# Patient Record
Sex: Female | Born: 1965 | Race: White | Hispanic: No | Marital: Married | Smoking: Never smoker
Health system: Southern US, Community
[De-identification: ages and names within clinical notes are randomized; demographics above are authoritative.]

## PROBLEM LIST (undated history)

## (undated) DIAGNOSIS — G709 Myoneural disorder, unspecified: Secondary | ICD-10-CM

## (undated) DIAGNOSIS — Z9889 Other specified postprocedural states: Secondary | ICD-10-CM

## (undated) DIAGNOSIS — K5792 Diverticulitis of intestine, part unspecified, without perforation or abscess without bleeding: Secondary | ICD-10-CM

## (undated) DIAGNOSIS — K8020 Calculus of gallbladder without cholecystitis without obstruction: Secondary | ICD-10-CM

## (undated) DIAGNOSIS — R112 Nausea with vomiting, unspecified: Secondary | ICD-10-CM

## (undated) DIAGNOSIS — R51 Headache: Secondary | ICD-10-CM

## (undated) DIAGNOSIS — Z889 Allergy status to unspecified drugs, medicaments and biological substances status: Secondary | ICD-10-CM

## (undated) DIAGNOSIS — E039 Hypothyroidism, unspecified: Secondary | ICD-10-CM

## (undated) DIAGNOSIS — M1990 Unspecified osteoarthritis, unspecified site: Secondary | ICD-10-CM

## (undated) DIAGNOSIS — I10 Essential (primary) hypertension: Secondary | ICD-10-CM

## (undated) DIAGNOSIS — R519 Headache, unspecified: Secondary | ICD-10-CM

## (undated) DIAGNOSIS — E785 Hyperlipidemia, unspecified: Secondary | ICD-10-CM

## (undated) DIAGNOSIS — E079 Disorder of thyroid, unspecified: Secondary | ICD-10-CM

## (undated) HISTORY — PX: NOSE SURGERY: SHX723

## (undated) HISTORY — DX: Myoneural disorder, unspecified: G70.9

## (undated) HISTORY — DX: Unspecified osteoarthritis, unspecified site: M19.90

## (undated) HISTORY — PX: WISDOM TOOTH EXTRACTION: SHX21

## (undated) HISTORY — DX: Hyperlipidemia, unspecified: E78.5

## (undated) HISTORY — DX: Disorder of thyroid, unspecified: E07.9

## (undated) HISTORY — DX: Essential (primary) hypertension: I10

---

## 1998-06-15 ENCOUNTER — Other Ambulatory Visit: Admission: RE | Admit: 1998-06-15 | Discharge: 1998-06-15 | Payer: Self-pay | Admitting: *Deleted

## 1999-09-07 ENCOUNTER — Other Ambulatory Visit: Admission: RE | Admit: 1999-09-07 | Discharge: 1999-09-07 | Payer: Self-pay | Admitting: *Deleted

## 2000-09-19 ENCOUNTER — Other Ambulatory Visit: Admission: RE | Admit: 2000-09-19 | Discharge: 2000-09-19 | Payer: Self-pay | Admitting: *Deleted

## 2002-04-28 ENCOUNTER — Other Ambulatory Visit: Admission: RE | Admit: 2002-04-28 | Discharge: 2002-04-28 | Payer: Self-pay | Admitting: Obstetrics and Gynecology

## 2002-11-30 ENCOUNTER — Inpatient Hospital Stay (HOSPITAL_COMMUNITY): Admission: AD | Admit: 2002-11-30 | Discharge: 2002-12-02 | Payer: Self-pay | Admitting: Obstetrics and Gynecology

## 2003-01-14 ENCOUNTER — Other Ambulatory Visit: Admission: RE | Admit: 2003-01-14 | Discharge: 2003-01-14 | Payer: Self-pay | Admitting: Obstetrics and Gynecology

## 2003-08-05 ENCOUNTER — Encounter: Admission: RE | Admit: 2003-08-05 | Discharge: 2003-08-05 | Payer: Self-pay | Admitting: Internal Medicine

## 2004-02-19 ENCOUNTER — Other Ambulatory Visit: Admission: RE | Admit: 2004-02-19 | Discharge: 2004-02-19 | Payer: Self-pay

## 2004-02-19 ENCOUNTER — Encounter: Admission: RE | Admit: 2004-02-19 | Discharge: 2004-02-19 | Payer: Self-pay | Admitting: Internal Medicine

## 2004-02-19 ENCOUNTER — Encounter (INDEPENDENT_AMBULATORY_CARE_PROVIDER_SITE_OTHER): Payer: Self-pay | Admitting: *Deleted

## 2004-04-01 ENCOUNTER — Encounter: Admission: RE | Admit: 2004-04-01 | Discharge: 2004-04-01 | Payer: Self-pay | Admitting: Internal Medicine

## 2005-10-13 ENCOUNTER — Encounter: Admission: RE | Admit: 2005-10-13 | Discharge: 2005-10-13 | Payer: Self-pay | Admitting: Neurology

## 2006-05-17 ENCOUNTER — Ambulatory Visit: Payer: Self-pay | Admitting: Orthopedic Surgery

## 2006-06-07 ENCOUNTER — Ambulatory Visit: Payer: Self-pay | Admitting: Orthopedic Surgery

## 2006-07-25 DIAGNOSIS — Z8679 Personal history of other diseases of the circulatory system: Secondary | ICD-10-CM | POA: Insufficient documentation

## 2006-07-26 ENCOUNTER — Ambulatory Visit: Payer: Self-pay | Admitting: Orthopedic Surgery

## 2006-11-07 ENCOUNTER — Encounter: Admission: RE | Admit: 2006-11-07 | Discharge: 2006-11-07 | Payer: Self-pay | Admitting: Obstetrics and Gynecology

## 2006-12-21 ENCOUNTER — Ambulatory Visit: Payer: Self-pay | Admitting: Vascular Surgery

## 2006-12-21 ENCOUNTER — Ambulatory Visit (HOSPITAL_COMMUNITY): Admission: RE | Admit: 2006-12-21 | Discharge: 2006-12-21 | Payer: Self-pay | Admitting: Internal Medicine

## 2007-01-10 ENCOUNTER — Encounter: Admission: RE | Admit: 2007-01-10 | Discharge: 2007-01-10 | Payer: Self-pay | Admitting: Internal Medicine

## 2007-04-17 ENCOUNTER — Encounter: Admission: RE | Admit: 2007-04-17 | Discharge: 2007-04-17 | Payer: Self-pay | Admitting: Endocrinology

## 2007-12-08 ENCOUNTER — Encounter: Admission: RE | Admit: 2007-12-08 | Discharge: 2007-12-08 | Payer: Self-pay | Admitting: Orthopedic Surgery

## 2009-10-13 ENCOUNTER — Encounter: Admission: RE | Admit: 2009-10-13 | Discharge: 2009-10-13 | Payer: Self-pay | Admitting: Surgery

## 2010-01-23 ENCOUNTER — Encounter: Payer: Self-pay | Admitting: Obstetrics and Gynecology

## 2010-04-23 ENCOUNTER — Emergency Department (HOSPITAL_COMMUNITY)
Admission: EM | Admit: 2010-04-23 | Discharge: 2010-04-23 | Disposition: A | Discharge status: Home / Self Care | Payer: Medicare Other | Attending: Emergency Medicine | Admitting: Emergency Medicine

## 2010-04-23 ENCOUNTER — Emergency Department (HOSPITAL_COMMUNITY): Payer: Medicare Other

## 2010-04-23 DIAGNOSIS — E039 Hypothyroidism, unspecified: Secondary | ICD-10-CM | POA: Insufficient documentation

## 2010-04-23 DIAGNOSIS — E860 Dehydration: Secondary | ICD-10-CM | POA: Insufficient documentation

## 2010-04-23 DIAGNOSIS — K5732 Diverticulitis of large intestine without perforation or abscess without bleeding: Secondary | ICD-10-CM | POA: Insufficient documentation

## 2010-04-23 DIAGNOSIS — E669 Obesity, unspecified: Secondary | ICD-10-CM | POA: Insufficient documentation

## 2010-04-23 DIAGNOSIS — R350 Frequency of micturition: Secondary | ICD-10-CM | POA: Insufficient documentation

## 2010-04-23 DIAGNOSIS — R10819 Abdominal tenderness, unspecified site: Secondary | ICD-10-CM | POA: Insufficient documentation

## 2010-04-23 DIAGNOSIS — R109 Unspecified abdominal pain: Secondary | ICD-10-CM | POA: Insufficient documentation

## 2010-04-23 DIAGNOSIS — R197 Diarrhea, unspecified: Secondary | ICD-10-CM | POA: Insufficient documentation

## 2010-04-23 DIAGNOSIS — I10 Essential (primary) hypertension: Secondary | ICD-10-CM | POA: Insufficient documentation

## 2010-04-23 DIAGNOSIS — E119 Type 2 diabetes mellitus without complications: Secondary | ICD-10-CM | POA: Insufficient documentation

## 2010-04-23 LAB — BASIC METABOLIC PANEL
BUN: 5 mg/dL — ABNORMAL LOW (ref 6–23)
CO2: 26 mEq/L (ref 19–32)
Calcium: 9 mg/dL (ref 8.4–10.5)
Chloride: 101 mEq/L (ref 96–112)
Creatinine, Ser: 0.66 mg/dL (ref 0.4–1.2)
GFR calc Af Amer: >60 mL/min (ref >60)
GFR calc non Af Amer: >60 mL/min (ref >60)
Glucose, Bld: 169 mg/dL — ABNORMAL HIGH (ref 70–99)
Potassium: 4.3 mEq/L (ref 3.5–5.1)
Sodium: 137 mEq/L (ref 135–145)

## 2010-04-23 LAB — CBC
HCT: 41.6 % (ref 36.0–46.0)
Hemoglobin: 13.8 g/dL (ref 12.0–15.0)
MCH: 29.8 pg (ref 26.0–34.0)
MCHC: 33.2 g/dL (ref 30.0–36.0)
MCV: 89.8 fL (ref 78.0–100.0)
Platelets: 297 10*3/uL (ref 150–400)
WBC: 16.8 10*3/uL — ABNORMAL HIGH (ref 4.0–10.5)

## 2010-04-23 LAB — HEPATIC FUNCTION PANEL
ALT: 18 U/L (ref 0–35)
AST: 17 U/L (ref 0–37)
Albumin: 3.6 g/dL (ref 3.5–5.2)
Alkaline Phosphatase: 91 U/L (ref 39–117)
Bilirubin, Direct: 0.1 mg/dL (ref 0.0–0.3)
Indirect Bilirubin: 0.7 mg/dL (ref 0.3–0.9)
Total Bilirubin: 0.8 mg/dL (ref 0.3–1.2)
Total Protein: 6.9 g/dL (ref 6.0–8.3)

## 2010-04-23 LAB — DIFFERENTIAL
Basophils Relative: 0 % (ref 0–1)
Eosinophils Absolute: 0.2 10*3/uL (ref 0.0–0.7)
Eosinophils Relative: 1 % (ref 0–5)
Lymphocytes Relative: 12 % (ref 12–46)
Lymphs Abs: 2 10*3/uL (ref 0.7–4.0)
Monocytes Absolute: 1.4 10*3/uL — ABNORMAL HIGH (ref 0.1–1.0)
Monocytes Relative: 8 % (ref 3–12)
Neutro Abs: 13.1 10*3/uL — ABNORMAL HIGH (ref 1.7–7.7)
Neutrophils Relative %: 78 % — ABNORMAL HIGH (ref 43–77)

## 2010-04-23 LAB — URINALYSIS, ROUTINE W REFLEX MICROSCOPIC
Bilirubin Urine: NEGATIVE
Glucose, UA: NEGATIVE mg/dL
Hgb urine dipstick: NEGATIVE
Ketones, ur: 15 mg/dL — AB
Leukocytes, UA: NEGATIVE
Nitrite: NEGATIVE
Protein, ur: 100 mg/dL — AB
Specific Gravity, Urine: 1.01 (ref 1.005–1.030)
Urobilinogen, UA: 0.2 mg/dL (ref 0.0–1.0)

## 2010-04-23 LAB — LIPASE, BLOOD: Lipase: 18 U/L (ref 11–59)

## 2010-04-23 LAB — URINE MICROSCOPIC-ADD ON

## 2010-04-23 LAB — POCT PREGNANCY, URINE: Preg Test, Ur: NEGATIVE

## 2010-04-23 MED ORDER — IOHEXOL 300 MG/ML  SOLN
150.0000 mL | Freq: Once | INTRAMUSCULAR | Status: AC | PRN
  Administered 2010-04-23: 150 mL via INTRAVENOUS

## 2010-05-20 NOTE — H&P (Signed)
Jacqueline Ayala, Jacqueline Ayala                      ACCOUNT NO.:  [id number]   MEDICAL RECORD NO.:  [id number]                   PATIENT TYPE:  INP   LOCATION:  9169                                 FACILITY:  [facility]   PHYSICIAN:  Lenoard Aden, M.D.             DATE OF BIRTH:  Sep 06, 1965   DATE OF ADMISSION:  11/30/2002  DATE OF DISCHARGE:                                HISTORY & PHYSICAL   CHIEF COMPLAINT:  Chronic hypertension for induction.   HISTORY OF PRESENT ILLNESS:  The patient is a 45 year old white female, G2,  P81, EDD of December 13, 2002, at 38+ weeks for induction due to chronic  hypertension.   PAST MEDICAL HISTORY:  Remarkable for vacuum-assisted vaginal delivery of an  8 pound 12 ounce female.  No other medical or surgical hospitalizations.  Medical history of hypertension and hypothyroidism.   MEDICATIONS:  Aldomet and Synthroid.   PRENATAL LABORATORY DATA:  Blood type AB positive, Rh antibody negative,  rubella immune, hepatitis and HIV negative.   PHYSICAL EXAMINATION:  GENERAL:  Well-developed, obese white female in no  acute distress.  HEENT:  Normal.  LUNGS:  Clear.  HEART:  Regular rate and rhythm.  ABDOMEN:  Soft, gravid, and nontender.  Estimated fetal weight 7-1/2 to 8  pounds.  Cervix is 2 cm, 50%, vertex, -2.  EXTREMITIES:  No cords.  NEUROLOGIC:  Nonfocal.   IMPRESSION:  1. Intrauterine pregnancy at 30 weeks.  2. Chronic hypertension, stable on Aldomet.  3. Hypothyroidism, stable.  4. Advanced maternal age with normal amniocentesis.   PLAN:  Proceed with induction.  Risks and benefits discussed.  Watch blood  pressure closely.                                               Lenoard Aden, M.D.    RJT/MEDQ  D:  11/30/2002  T:  11/30/2002  Job:  [id number]

## 2010-07-04 ENCOUNTER — Other Ambulatory Visit: Payer: Self-pay | Admitting: Internal Medicine

## 2010-07-04 DIAGNOSIS — Z1231 Encounter for screening mammogram for malignant neoplasm of breast: Secondary | ICD-10-CM

## 2010-07-26 ENCOUNTER — Ambulatory Visit
Admission: RE | Admit: 2010-07-26 | Discharge: 2010-07-26 | Disposition: A | Discharge status: Home / Self Care | Payer: Medicare Other | Source: Ambulatory Visit | Attending: Internal Medicine | Admitting: Internal Medicine

## 2010-07-26 DIAGNOSIS — Z1231 Encounter for screening mammogram for malignant neoplasm of breast: Secondary | ICD-10-CM

## 2011-04-18 ENCOUNTER — Other Ambulatory Visit: Payer: Self-pay | Admitting: Gastroenterology

## 2011-04-18 DIAGNOSIS — R1013 Epigastric pain: Secondary | ICD-10-CM

## 2011-04-18 DIAGNOSIS — R1110 Vomiting, unspecified: Secondary | ICD-10-CM

## 2011-04-28 ENCOUNTER — Ambulatory Visit (HOSPITAL_COMMUNITY): Payer: Medicare Other

## 2011-04-28 ENCOUNTER — Ambulatory Visit (HOSPITAL_COMMUNITY)
Admission: RE | Admit: 2011-04-28 | Discharge: 2011-04-28 | Disposition: A | Discharge status: Home / Self Care | Payer: Medicare Other | Source: Ambulatory Visit | Attending: Gastroenterology | Admitting: Gastroenterology

## 2011-04-28 DIAGNOSIS — R1013 Epigastric pain: Secondary | ICD-10-CM | POA: Insufficient documentation

## 2011-04-28 DIAGNOSIS — R1110 Vomiting, unspecified: Secondary | ICD-10-CM | POA: Insufficient documentation

## 2011-04-28 DIAGNOSIS — K8020 Calculus of gallbladder without cholecystitis without obstruction: Secondary | ICD-10-CM | POA: Insufficient documentation

## 2011-05-13 ENCOUNTER — Emergency Department (HOSPITAL_COMMUNITY)
Admission: EM | Admit: 2011-05-13 | Discharge: 2011-05-13 | Disposition: A | Discharge status: Home / Self Care | Payer: Medicare Other | Attending: Emergency Medicine | Admitting: Emergency Medicine

## 2011-05-13 ENCOUNTER — Encounter (HOSPITAL_COMMUNITY): Payer: Self-pay

## 2011-05-13 DIAGNOSIS — K5792 Diverticulitis of intestine, part unspecified, without perforation or abscess without bleeding: Secondary | ICD-10-CM

## 2011-05-13 DIAGNOSIS — K5732 Diverticulitis of large intestine without perforation or abscess without bleeding: Secondary | ICD-10-CM | POA: Insufficient documentation

## 2011-05-13 DIAGNOSIS — R030 Elevated blood-pressure reading, without diagnosis of hypertension: Secondary | ICD-10-CM | POA: Insufficient documentation

## 2011-05-13 DIAGNOSIS — Z886 Allergy status to analgesic agent status: Secondary | ICD-10-CM | POA: Insufficient documentation

## 2011-05-13 HISTORY — DX: Diverticulitis of intestine, part unspecified, without perforation or abscess without bleeding: K57.92

## 2011-05-13 HISTORY — DX: Calculus of gallbladder without cholecystitis without obstruction: K80.20

## 2011-05-13 MED ORDER — CIPROFLOXACIN HCL 500 MG PO TABS: 500.0000 mg | ORAL_TABLET | Freq: Two times a day (BID) | ORAL | Status: AC

## 2011-05-13 MED ORDER — METRONIDAZOLE 500 MG PO TABS: 500.0000 mg | ORAL_TABLET | Freq: Two times a day (BID) | ORAL | Status: AC

## 2011-05-13 NOTE — ED Notes (Signed)
Hx of diverticulitis, began with abdominal pain last night, denies any n/v/d did have diarrhea last night.  Went to her MD this am and her WBC. Is 17.6 they did not feel comfortable treating that

## 2011-05-13 NOTE — ED Provider Notes (Signed)
History     CSN: [id number]  Arrival date & time 05/13/11  1039   First MD Initiated Contact with Patient 05/13/11 1112      Chief Complaint  Patient presents with  . Abdominal Pain    (Consider location/radiation/quality/duration/timing/severity/associated sxs/prior treatment) Patient is a 46 y.o. female presenting with abdominal pain. The history is provided by the patient and medical records.  Abdominal Pain  Pt with hx cholelithiasis and diverticulitis, acute onset left lower quadrant abdominal pain last night. Pain sharp, waxing and waning, nonradiating. She has had intermittent associated nausea when the pain becomes severe. She had one episode of nonbloody diarrhea last night, none today. She denies any associated fever, chills, chest pain, shortness of breath, vomiting, dysuria, hematuria, vaginal discharge. She is currently on her menses and denies any suspicion of pregnancy. Palpation of the area makes the pain worse, Toradol given to her by the walk-in clinic for primary Dr. has made the pain much better. No prior abdominal surgeries. Patient reports this feels just like prior episodes of diverticulitis when the pain begins. Her initial episode was in April 2012 and she had a CT scan confirming the diagnosis. In October 2012, she began to have symptoms and presented promptly to her primary doctor's office; he wrote her for prescriptions for Cipro and Flagyl and her symptoms improved quickly. Per note from urgent care physician, she had a leukocytosis with a white blood cell count of 17,600 and they were uncomfortable continuing her treatment there.  Past Medical History  Diagnosis Date  . Diverticulitis   . Gallstones     History reviewed. No pertinent past surgical history.  No family history on file.  History  Substance Use Topics  . Smoking status: Never Smoker   . Smokeless tobacco: Not on file  . Alcohol Use: No     Review of Systems  Gastrointestinal: Positive  for abdominal pain.  10 systems reviewed and are otherwise negative for acute change except as noted in the HPI.   Allergies  Codeine and Ibuprofen  Home Medications   Current Outpatient Rx  Name Route Sig Dispense Refill  . CALCIUM CARBONATE-VITAMIN D 500-200 MG-UNIT PO TABS Oral Take 1 tablet by mouth daily.    Marland Kitchen LEVOTHYROXINE SODIUM 175 MCG PO TABS Oral Take 175 mcg by mouth daily.    Marland Kitchen LORATADINE 10 MG PO TABS Oral Take 10 mg by mouth daily.    . ADULT MULTIVITAMIN W/MINERALS CH Oral Take 1 tablet by mouth daily.    Marland Kitchen RAMIPRIL 5 MG PO CAPS Oral Take 5 mg by mouth daily.      BP 160/69  Pulse 88  Temp(Src) 98.3 F (36.8 C) (Oral)  Resp 20  SpO2 99%  LMP 05/08/2011  Physical Exam  Constitutional: She appears well-developed and well-nourished. No distress.       Vital signs are reviewed and are significant for hypertension.  HENT:  Head: Normocephalic and atraumatic.  Right Ear: External ear normal.  Left Ear: External ear normal.       Oral mucosa moist  Eyes: Conjunctivae are normal. Pupils are equal, round, and reactive to light.  Neck: Neck supple.  Cardiovascular: Normal rate, regular rhythm, normal heart sounds and intact distal pulses.   Pulmonary/Chest: Effort normal and breath sounds normal. No respiratory distress. She has no wheezes. She exhibits no tenderness.  Abdominal: Soft. Bowel sounds are normal. She exhibits no distension. There is Tenderness: mild right upper quadrant tenderness without Murphy's sign. Moderate left  lower quadrant tenderness.. There is no rigidity, no rebound and no guarding.       Obese  Musculoskeletal: She exhibits no edema and no tenderness.  Neurological: She is alert.       Speech clear. Mental status appears appropriate for patient and situation.  Skin: Skin is warm and dry. No rash noted.  Psychiatric: She has a normal mood and affect.    ED Course  Procedures (including critical care time)  Labs Reviewed - No data to  display No results found.   Dx 1: Diverticulitis   MDM  I reviewed the labs from urgent care. Leukocytosis seen on CBC without anemia. Urinalysis significant only for hematuria-the patient is currently on her menses and this is the likely explanation. No peritoneal signs on exam. Good bowel sounds on exam. Afebrile without tachycardia. Given 1 episode of diarrhea, I do not feel an electrolyte panel is necessary. Most likely cause of pain is recurrence of diverticulitis. I am hesitant to re-image a young female in this situation. She will be treated with cipro and flagyl with strict return precautions. Pt has good follow-up and appears reasonable, agrees with this plan.        Shaaron Adler, [place] 05/13/11 1207

## 2011-05-13 NOTE — ED Notes (Signed)
Pt in room from triage, pt getting undressed, would like a larger gown to cover her back

## 2011-05-13 NOTE — Discharge Instructions (Signed)
As we discussed, please return for re-evaluation if you develop fever, worsening pain, or cannot keep down your medications.     Diverticulitis Small pockets or "bubbles" can develop in the wall of the intestine. Diverticulitis is when those pockets become infected and inflamed. This causes stomach pain (usually on the left side). HOME CARE  Take all medicine as told by your doctor.   Try a clear liquid diet (broth, tea, or water) for as long as told by your doctor.   Keep all follow-up visits with your doctor.   You may be put on a low-fiber diet once you start feeling better. Here are foods that have low-fiber:   White breads, cereals, rice, and pasta.   Cooked fruits and vegetables or soft fresh fruits and vegetables without the skin.   Ground or well-cooked tender beef, ham, veal, lamb, pork, or poultry.   Eggs and seafood.   After you are doing well on the low-fiber diet, you may be put on a high-fiber diet. Here are ways to increase your fiber:   Choose whole-grain breads, cereals, pasta, and brown rice.   Choose fruits and vegetables with skin on. Do not overcook the vegetables.   Choose nuts, seeds, legumes, dried peas, beans, and lentils.   Look for food products that have more than 3 grams of fiber per serving on the food label.  GET HELP RIGHT AWAY IF:  Your pain does not get better or gets worse.   You have trouble eating food.   You are not pooping (having bowel movements) like normal.   You have a temperature by mouth above 102 F (38.9 C), not controlled by medicine.   You keep throwing up (vomiting).   You have bloody or black, tarry poop (stools).   You are getting worse and not better.  MAKE SURE YOU:   Understand these instructions.   Will watch your condition.   Will get help right away if you are not doing well or get worse.  Document Released: 06/07/2007 Document Revised: 12/08/2010 Document Reviewed: 11/09/2008 [employer] Patient  Information 2012 [place], [place].

## 2011-05-13 NOTE — ED Notes (Signed)
Pt undressed and placed in a gown  

## 2011-05-15 NOTE — ED Provider Notes (Signed)
Medical screening examination/treatment/procedure(s) were performed by non-physician practitioner and as supervising physician I was immediately available for consultation/collaboration.   Geoffery Lyons, MD 05/15/11 [phone]

## 2011-06-08 ENCOUNTER — Encounter (INDEPENDENT_AMBULATORY_CARE_PROVIDER_SITE_OTHER): Payer: Self-pay | Admitting: General Surgery

## 2011-06-08 DIAGNOSIS — G600 Hereditary motor and sensory neuropathy: Secondary | ICD-10-CM | POA: Insufficient documentation

## 2011-06-08 DIAGNOSIS — E039 Hypothyroidism, unspecified: Secondary | ICD-10-CM | POA: Insufficient documentation

## 2011-06-08 DIAGNOSIS — K5792 Diverticulitis of intestine, part unspecified, without perforation or abscess without bleeding: Secondary | ICD-10-CM

## 2011-06-08 DIAGNOSIS — M1990 Unspecified osteoarthritis, unspecified site: Secondary | ICD-10-CM | POA: Insufficient documentation

## 2011-06-08 DIAGNOSIS — J302 Other seasonal allergic rhinitis: Secondary | ICD-10-CM

## 2011-06-08 DIAGNOSIS — E785 Hyperlipidemia, unspecified: Secondary | ICD-10-CM | POA: Insufficient documentation

## 2011-06-15 ENCOUNTER — Ambulatory Visit (INDEPENDENT_AMBULATORY_CARE_PROVIDER_SITE_OTHER): Payer: Medicare Other | Admitting: Surgery

## 2011-06-15 ENCOUNTER — Other Ambulatory Visit (INDEPENDENT_AMBULATORY_CARE_PROVIDER_SITE_OTHER): Payer: Self-pay | Admitting: Surgery

## 2011-06-15 ENCOUNTER — Encounter (INDEPENDENT_AMBULATORY_CARE_PROVIDER_SITE_OTHER): Payer: Self-pay | Admitting: Surgery

## 2011-06-15 VITALS — BP 134/90 | HR 89 | Temp 98.0°F | Ht 67.5 in | Wt 290.4 lb

## 2011-06-15 DIAGNOSIS — K8020 Calculus of gallbladder without cholecystitis without obstruction: Secondary | ICD-10-CM

## 2011-06-15 NOTE — Progress Notes (Signed)
Chief Complaint:  RUQ pain and gallstones  History of Present Illness:  Jacqueline Ayala is an 45 y.o. 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  She has been Having upper abdominal pain after eating fatty foods. To her credit she has been on a diet and lost a lot of weight in that was probably the origin of her gallstones. She is followed by Dr. polite.  I discussed laparoscopic cholecystectomy with her including its complications not limited to, bile duct injury bile leak or injury to surrounding bowel. She is aware of the risk. She wants to proceed with cholecystectomy.  Past Medical History  Diagnosis Date  . Diverticulitis   . Gallstones   . Arthritis   . Hyperlipidemia   . Hypertension   . Neuromuscular disorder   . Thyroid disease     Past Surgical History  Procedure Date  . Nose surgery     removal of bone spur  . Wisdom tooth extraction     Current Outpatient Prescriptions  Medication Sig Dispense Refill  . BIOTIN PO Take by mouth.      . calcium-vitamin D (OSCAL WITH D) 500-200 MG-UNIT per tablet Take 1 tablet by mouth daily.      . fish oil-omega-3 fatty acids 1000 MG capsule Take 2 g by mouth daily.      . levothyroxine (SYNTHROID, LEVOTHROID) 175 MCG tablet Take 175 mcg by mouth daily.      . loratadine (CLARITIN) 10 MG tablet Take 10 mg by mouth daily.      . metFORMIN (GLUCOPHAGE) 1000 MG tablet Take 1,000 mg by mouth 2 (two) times daily with a meal.      . Multiple Vitamin (MULITIVITAMIN WITH MINERALS) TABS Take 1 tablet by mouth daily.      . ramipril (ALTACE) 5 MG capsule Take 5 mg by mouth daily.       Codeine and Ibuprofen Family History  Problem Relation Age of Onset  . Heart disease Father    Social History:   reports that she has never smoked. She does not have any smokeless tobacco history on file. She reports that she does not drink alcohol or use illicit drugs.   REVIEW OF SYSTEMS - PERTINENT POSITIVES ONLY: CMT  as mentioned above.  This is demyelinating disease of the distal limbs  Physical Exam:   Blood pressure 134/90, pulse 89, temperature 98 F (36.7 C), temperature source Temporal, height 5' 7.5" (1.715 m), weight 290 lb 6.4 oz (131.725 kg), SpO2 97.00%. Body mass index is 44.81 kg/(m^2).  Gen:  WDWN WF NAD  Neurological: Alert and oriented to person, place, and time. Motor and sensory function is grossly intact  Head: Normocephalic and atraumatic.  Eyes: Conjunctivae are normal. Pupils are equal, round, and reactive to light. No scleral icterus.  Neck: Normal range of motion. Neck supple. No tracheal deviation or thyromegaly present.  Cardiovascular:  SR without murmurs or gallops.  No carotid bruits Respiratory: Effort normal.  No respiratory distress. No chest wall tenderness. Breath sounds normal.  No wheezes, rales or rhonchi.  Abdomen:  Obese but nontender GU: Musculoskeletal: Normal range of motion. Extremities are nontender. No cyanosis, edema or clubbing noted Lymphadenopathy: No cervical, preauricular, postauricular or axillary adenopathy is present Skin: Skin is warm and dry. No rash noted. No diaphoresis. No erythema. No pallor. Pscyh: Normal mood and affect. Behavior is normal. Judgment and thought content normal.   LABORATORY RESULTS: No results   found for this or any previous visit (from the past 48 hour(s)).  RADIOLOGY RESULTS: No results found.  Problem List: Patient Active Problem List  Diagnosis  . HIGH BLOOD PRESSURE  . Hypothyroidism  . Hyperlipemia  . Arthritis  . Charcot-Marie-Tooth disease  . Seasonal allergies  . Diverticulitis    Assessment & Plan: Gallstones (symptomatic)  Lap chole at [facility]    Matt B. Markian Glockner, MD, FACS  [facility] [phone] beeper [phone]  06/15/2011 10:56 AM     

## 2011-06-15 NOTE — Patient Instructions (Signed)

## 2011-06-19 ENCOUNTER — Encounter (HOSPITAL_COMMUNITY)
Admission: RE | Admit: 2011-06-19 | Discharge: 2011-06-19 | Disposition: A | Discharge status: Home / Self Care | Payer: Medicare Other | Source: Ambulatory Visit | Attending: Surgery | Admitting: Surgery

## 2011-06-19 ENCOUNTER — Encounter (HOSPITAL_COMMUNITY): Payer: Self-pay | Admitting: Pharmacy Technician

## 2011-06-19 ENCOUNTER — Ambulatory Visit (HOSPITAL_COMMUNITY)
Admission: RE | Admit: 2011-06-19 | Discharge: 2011-06-19 | Disposition: A | Discharge status: Home / Self Care | Payer: Medicare Other | Source: Ambulatory Visit | Attending: Surgery | Admitting: Surgery

## 2011-06-19 ENCOUNTER — Encounter (HOSPITAL_COMMUNITY): Payer: Self-pay

## 2011-06-19 DIAGNOSIS — I10 Essential (primary) hypertension: Secondary | ICD-10-CM | POA: Insufficient documentation

## 2011-06-19 DIAGNOSIS — E119 Type 2 diabetes mellitus without complications: Secondary | ICD-10-CM | POA: Insufficient documentation

## 2011-06-19 DIAGNOSIS — K8020 Calculus of gallbladder without cholecystitis without obstruction: Secondary | ICD-10-CM | POA: Insufficient documentation

## 2011-06-19 DIAGNOSIS — Z01810 Encounter for preprocedural cardiovascular examination: Secondary | ICD-10-CM | POA: Insufficient documentation

## 2011-06-19 DIAGNOSIS — Z01812 Encounter for preprocedural laboratory examination: Secondary | ICD-10-CM | POA: Insufficient documentation

## 2011-06-19 HISTORY — DX: Other specified postprocedural states: Z98.890

## 2011-06-19 HISTORY — DX: Nausea with vomiting, unspecified: R11.2

## 2011-06-19 HISTORY — DX: Hypothyroidism, unspecified: E03.9

## 2011-06-19 LAB — CBC
HCT: 40.6 % (ref 36.0–46.0)
Hemoglobin: 13.1 g/dL (ref 12.0–15.0)
WBC: 10 10*3/uL (ref 4.0–10.5)

## 2011-06-19 LAB — SURGICAL PCR SCREEN
MRSA, PCR: NEGATIVE
Staphylococcus aureus: NEGATIVE

## 2011-06-19 LAB — BASIC METABOLIC PANEL
BUN: 10 mg/dL (ref 6–23)
Chloride: 103 mEq/L (ref 96–112)
GFR calc Af Amer: >90 mL/min (ref >90)
Glucose, Bld: 83 mg/dL (ref 70–99)
Potassium: 4 mEq/L (ref 3.5–5.1)
Sodium: 141 mEq/L (ref 135–145)

## 2011-06-19 NOTE — Patient Instructions (Signed)
20 Ermel M. O'Briant  06/19/2011   Your procedure is scheduled on:  06/21/11 115pm-255pm  Report to [facility] at 1045 AM.  Call this number if you have problems the morning of surgery: [phone]   Remember: Fleets enema nite before surgery    Do not eat food:After Midnight.  May have clear liquids:until 0700am then npo .  Clear liquids include soda, tea, black coffee, apple or grape juice, broth.  Take these medicines the morning of surgery with A SIP OF WATER:    Do not wear jewelry, make-up or nail polish.  Do not wear lotions, powders, or perfumes.   Do not shave 48 hours prior to surgery. .  Do not bring valuables to the hospital.  Contacts, dentures or bridgework may not be worn into surgery.  Leave suitcase in the car. After surgery it may be brought to your room.  For patients admitted to the hospital, checkout time is 11:00 AM the day of discharge.  Marland Kitchen   Special Instructions: CHG Shower Use Special Wash: 1/2 bottle night before surgery and 1/2 bottle morning of surgery. shower chin to toes with CHG.  Wash face and private parts with regular soap.     Please read over the following fact sheets that you were given: MRSA Information, coughing and deep breathing exercises, leg exercises

## 2011-06-19 NOTE — Pre-Procedure Instructions (Signed)
06/19/11 requested most recent office visit note by fax from [facility] with medical release form completed.

## 2011-06-19 NOTE — Pre-Procedure Instructions (Signed)
06/19/11 patient concerned over fleets enema and recent exacerbation of divierticulosis.  Pt to call office of Dr Luretha Murphy and question fleets enema.

## 2011-06-20 NOTE — Progress Notes (Signed)
06/20/11 Confirmed EKG in EPIC from 06/19/11 shown to Dr Acey Lav.Marland Kitchen PCP of Dr Renford Dills  Has no ekg in file for patient.  Dr Acey Lav approved ekg for surgery.

## 2011-06-21 ENCOUNTER — Encounter (HOSPITAL_COMMUNITY)
Admission: RE | Disposition: A | Discharge status: Home / Self Care | Payer: Self-pay | Source: Ambulatory Visit | Attending: Surgery

## 2011-06-21 ENCOUNTER — Encounter (HOSPITAL_COMMUNITY): Payer: Self-pay | Admitting: Anesthesiology

## 2011-06-21 ENCOUNTER — Ambulatory Visit (HOSPITAL_COMMUNITY): Payer: Medicare Other

## 2011-06-21 ENCOUNTER — Ambulatory Visit (HOSPITAL_COMMUNITY)
Admission: RE | Admit: 2011-06-21 | Discharge: 2011-06-22 | Disposition: A | Discharge status: Home / Self Care | Payer: Medicare Other | Source: Ambulatory Visit | Attending: Surgery | Admitting: Surgery

## 2011-06-21 ENCOUNTER — Encounter (HOSPITAL_COMMUNITY): Payer: Self-pay | Admitting: *Deleted

## 2011-06-21 ENCOUNTER — Ambulatory Visit (HOSPITAL_COMMUNITY): Payer: Medicare Other | Admitting: Anesthesiology

## 2011-06-21 DIAGNOSIS — I10 Essential (primary) hypertension: Secondary | ICD-10-CM | POA: Insufficient documentation

## 2011-06-21 DIAGNOSIS — Z79899 Other long term (current) drug therapy: Secondary | ICD-10-CM | POA: Insufficient documentation

## 2011-06-21 DIAGNOSIS — Z8679 Personal history of other diseases of the circulatory system: Secondary | ICD-10-CM

## 2011-06-21 DIAGNOSIS — E785 Hyperlipidemia, unspecified: Secondary | ICD-10-CM | POA: Insufficient documentation

## 2011-06-21 DIAGNOSIS — K8010 Calculus of gallbladder with chronic cholecystitis without obstruction: Secondary | ICD-10-CM | POA: Insufficient documentation

## 2011-06-21 HISTORY — PX: CHOLECYSTECTOMY: SHX55

## 2011-06-21 LAB — CBC
HCT: 41.3 % (ref 36.0–46.0)
MCH: 30.3 pg (ref 26.0–34.0)
MCV: 91.4 fL (ref 78.0–100.0)
Platelets: 322 10*3/uL (ref 150–400)
RDW: 13.8 % (ref 11.5–15.5)

## 2011-06-21 SURGERY — LAPAROSCOPIC CHOLECYSTECTOMY WITH INTRAOPERATIVE CHOLANGIOGRAM
Anesthesia: General | Site: Abdomen | Wound class: Clean Contaminated

## 2011-06-21 MED ORDER — BUPIVACAINE LIPOSOME 1.3 % IJ SUSP
INTRAMUSCULAR | Status: DC | PRN
  Administered 2011-06-21: 20 mL

## 2011-06-21 MED ORDER — CEFAZOLIN SODIUM-DEXTROSE 2-3 GM-% IV SOLR
2.0000 g | INTRAVENOUS | Status: AC
  Administered 2011-06-21: 2 g via INTRAVENOUS

## 2011-06-21 MED ORDER — IOHEXOL 300 MG/ML  SOLN
INTRAMUSCULAR | Status: AC
  Filled 2011-06-21: qty 1

## 2011-06-21 MED ORDER — HEPARIN SODIUM (PORCINE) 5000 UNIT/ML IJ SOLN
INTRAMUSCULAR | Status: AC
  Filled 2011-06-21: qty 1

## 2011-06-21 MED ORDER — NEOSTIGMINE METHYLSULFATE 1 MG/ML IJ SOLN
INTRAMUSCULAR | Status: DC | PRN
  Administered 2011-06-21: 5 mg via INTRAVENOUS

## 2011-06-21 MED ORDER — ACETAMINOPHEN 10 MG/ML IV SOLN
INTRAVENOUS | Status: DC | PRN
  Administered 2011-06-21: 1000 mg via INTRAVENOUS

## 2011-06-21 MED ORDER — LACTATED RINGERS IR SOLN
Status: DC | PRN
  Administered 2011-06-21: 1000 mL

## 2011-06-21 MED ORDER — DEXAMETHASONE SODIUM PHOSPHATE 4 MG/ML IJ SOLN
INTRAMUSCULAR | Status: DC | PRN
  Administered 2011-06-21: 10 mg via INTRAVENOUS

## 2011-06-21 MED ORDER — SODIUM CHLORIDE 0.9 % IR SOLN
Status: DC | PRN
  Administered 2011-06-21: 1000 mL

## 2011-06-21 MED ORDER — KCL IN DEXTROSE-NACL 20-5-0.45 MEQ/L-%-% IV SOLN
INTRAVENOUS | Status: DC
  Administered 2011-06-21: 19:00:00 via INTRAVENOUS
  Filled 2011-06-21 (×3): qty 1000

## 2011-06-21 MED ORDER — GLYCOPYRROLATE 0.2 MG/ML IJ SOLN
INTRAMUSCULAR | Status: DC | PRN
  Administered 2011-06-21: .8 mg via INTRAVENOUS

## 2011-06-21 MED ORDER — LEVOTHYROXINE SODIUM 175 MCG PO TABS
175.0000 ug | ORAL_TABLET | Freq: Every day | ORAL | Status: DC
  Administered 2011-06-22: 175 ug via ORAL
  Filled 2011-06-21 (×2): qty 1

## 2011-06-21 MED ORDER — LIDOCAINE HCL (CARDIAC) 20 MG/ML IV SOLN
INTRAVENOUS | Status: DC | PRN
  Administered 2011-06-21: 100 mg via INTRAVENOUS

## 2011-06-21 MED ORDER — RAMIPRIL 5 MG PO CAPS
5.0000 mg | ORAL_CAPSULE | Freq: Every day | ORAL | Status: DC
  Administered 2011-06-21: 5 mg via ORAL
  Filled 2011-06-21 (×2): qty 1

## 2011-06-21 MED ORDER — IOHEXOL 300 MG/ML  SOLN
INTRAMUSCULAR | Status: DC | PRN
  Administered 2011-06-21: 17.5 mL via INTRAVENOUS

## 2011-06-21 MED ORDER — FLEET ENEMA 7-19 GM/118ML RE ENEM: 1.0000 | ENEMA | Freq: Once | RECTAL | Status: DC

## 2011-06-21 MED ORDER — CHLORHEXIDINE GLUCONATE 4 % EX LIQD
1.0000 "application " | Freq: Once | CUTANEOUS | Status: DC
  Filled 2011-06-21: qty 15

## 2011-06-21 MED ORDER — OXYCODONE-ACETAMINOPHEN 5-325 MG PO TABS
1.0000 | ORAL_TABLET | ORAL | Status: DC | PRN
  Administered 2011-06-21: 1 via ORAL
  Administered 2011-06-22 (×2): 2 via ORAL
  Filled 2011-06-21: qty 1
  Filled 2011-06-21 (×2): qty 2

## 2011-06-21 MED ORDER — LABETALOL HCL 5 MG/ML IV SOLN
INTRAVENOUS | Status: DC | PRN
  Administered 2011-06-21: 5 mg via INTRAVENOUS

## 2011-06-21 MED ORDER — LACTATED RINGERS IV SOLN: INTRAVENOUS | Status: DC

## 2011-06-21 MED ORDER — CEFAZOLIN SODIUM-DEXTROSE 2-3 GM-% IV SOLR
INTRAVENOUS | Status: AC
  Filled 2011-06-21: qty 50

## 2011-06-21 MED ORDER — SCOPOLAMINE 1 MG/3DAYS TD PT72
MEDICATED_PATCH | TRANSDERMAL | Status: AC
  Filled 2011-06-21: qty 1

## 2011-06-21 MED ORDER — PROMETHAZINE HCL 25 MG/ML IJ SOLN: 6.2500 mg | INTRAMUSCULAR | Status: DC | PRN

## 2011-06-21 MED ORDER — ONDANSETRON HCL 4 MG/2ML IJ SOLN
INTRAMUSCULAR | Status: DC | PRN
  Administered 2011-06-21: 4 mg via INTRAVENOUS

## 2011-06-21 MED ORDER — LACTATED RINGERS IV SOLN
INTRAVENOUS | Status: DC
  Administered 2011-06-21 (×2): via INTRAVENOUS

## 2011-06-21 MED ORDER — METFORMIN HCL 500 MG PO TABS
1000.0000 mg | ORAL_TABLET | Freq: Two times a day (BID) | ORAL | Status: DC
  Administered 2011-06-22: 1000 mg via ORAL
  Filled 2011-06-21 (×3): qty 2

## 2011-06-21 MED ORDER — HEPARIN SODIUM (PORCINE) 5000 UNIT/ML IJ SOLN
5000.0000 [IU] | Freq: Once | INTRAMUSCULAR | Status: AC
  Administered 2011-06-21: 5000 [IU] via SUBCUTANEOUS

## 2011-06-21 MED ORDER — INSULIN ASPART 100 UNIT/ML ~~LOC~~ SOLN
0.0000 [IU] | SUBCUTANEOUS | Status: DC
  Administered 2011-06-21 – 2011-06-22 (×3): 3 [IU] via SUBCUTANEOUS

## 2011-06-21 MED ORDER — BUPIVACAINE-EPINEPHRINE PF 0.25-1:200000 % IJ SOLN
INTRAMUSCULAR | Status: AC
  Filled 2011-06-21: qty 30

## 2011-06-21 MED ORDER — INSULIN GLARGINE 100 UNIT/ML ~~LOC~~ SOLN
5.0000 [IU] | Freq: Every day | SUBCUTANEOUS | Status: DC
  Administered 2011-06-21: 5 [IU] via SUBCUTANEOUS

## 2011-06-21 MED ORDER — SCOPOLAMINE 1 MG/3DAYS TD PT72
1.0000 | MEDICATED_PATCH | TRANSDERMAL | Status: DC
  Administered 2011-06-21: 1.5 mg via TRANSDERMAL
  Filled 2011-06-21: qty 1

## 2011-06-21 MED ORDER — BUPIVACAINE LIPOSOME 1.3 % IJ SUSP
20.0000 mL | Freq: Once | INTRAMUSCULAR | Status: DC
  Filled 2011-06-21: qty 20

## 2011-06-21 MED ORDER — HEPARIN SODIUM (PORCINE) 5000 UNIT/ML IJ SOLN
5000.0000 [IU] | Freq: Three times a day (TID) | INTRAMUSCULAR | Status: DC
  Administered 2011-06-21 – 2011-06-22 (×2): 5000 [IU] via SUBCUTANEOUS
  Filled 2011-06-21 (×5): qty 1

## 2011-06-21 MED ORDER — HYDROMORPHONE HCL PF 1 MG/ML IJ SOLN
1.0000 mg | INTRAMUSCULAR | Status: DC | PRN
  Administered 2011-06-22: 1 mg via INTRAVENOUS
  Filled 2011-06-21: qty 1

## 2011-06-21 MED ORDER — HYDROMORPHONE HCL PF 1 MG/ML IJ SOLN
INTRAMUSCULAR | Status: DC | PRN
  Administered 2011-06-21 (×4): .4 mg via INTRAVENOUS

## 2011-06-21 MED ORDER — CISATRACURIUM BESYLATE (PF) 10 MG/5ML IV SOLN
INTRAVENOUS | Status: DC | PRN
  Administered 2011-06-21: 1 mg via INTRAVENOUS
  Administered 2011-06-21: 2 mg via INTRAVENOUS
  Administered 2011-06-21: 12 mg via INTRAVENOUS
  Administered 2011-06-21: 1 mg via INTRAVENOUS

## 2011-06-21 MED ORDER — SODIUM CHLORIDE 0.9 % IJ SOLN
INTRAMUSCULAR | Status: DC | PRN
  Administered 2011-06-21: 20 mL

## 2011-06-21 MED ORDER — PROPOFOL 10 MG/ML IV EMUL
INTRAVENOUS | Status: DC | PRN
  Administered 2011-06-21: 200 mg via INTRAVENOUS

## 2011-06-21 MED ORDER — MIDAZOLAM HCL 5 MG/5ML IJ SOLN
INTRAMUSCULAR | Status: DC | PRN
  Administered 2011-06-21: 2 mg via INTRAVENOUS

## 2011-06-21 MED ORDER — HYDROMORPHONE HCL PF 1 MG/ML IJ SOLN: 0.2500 mg | INTRAMUSCULAR | Status: DC | PRN

## 2011-06-21 MED ORDER — FENTANYL CITRATE 0.05 MG/ML IJ SOLN
INTRAMUSCULAR | Status: DC | PRN
  Administered 2011-06-21: 50 ug via INTRAVENOUS
  Administered 2011-06-21: 150 ug via INTRAVENOUS
  Administered 2011-06-21: 100 ug via INTRAVENOUS
  Administered 2011-06-21: 50 ug via INTRAVENOUS

## 2011-06-21 SURGICAL SUPPLY — 49 items
APPLIER CLIP 5 13 M/L LIGAMAX5 (MISCELLANEOUS)
APPLIER CLIP ROT 10 11.4 M/L (STAPLE)
BENZOIN TINCTURE PRP APPL 2/3 (GAUZE/BANDAGES/DRESSINGS) IMPLANT
CABLE HIGH FREQUENCY MONO STRZ (ELECTRODE) IMPLANT
CANISTER SUCTION 2500CC (MISCELLANEOUS) ×2 IMPLANT
CATH REDDICK CHOLANGI 4FR 50CM (CATHETERS) IMPLANT
CLIP APPLIE 5 13 M/L LIGAMAX5 (MISCELLANEOUS) IMPLANT
CLIP APPLIE ROT 10 11.4 M/L (STAPLE) IMPLANT
CLOTH BEACON ORANGE TIMEOUT ST (SAFETY) ×2 IMPLANT
COVER MAYO STAND STRL (DRAPES) ×2 IMPLANT
COVER SURGICAL LIGHT HANDLE (MISCELLANEOUS) ×2 IMPLANT
DECANTER SPIKE VIAL GLASS SM (MISCELLANEOUS) ×4 IMPLANT
DERMABOND ADVANCED (GAUZE/BANDAGES/DRESSINGS) ×1
DERMABOND ADVANCED .7 DNX12 (GAUZE/BANDAGES/DRESSINGS) ×1 IMPLANT
DEVICE TROCAR PUNCTURE CLOSURE (ENDOMECHANICALS) ×2 IMPLANT
DRAPE C-ARM 42X72 X-RAY (DRAPES) ×2 IMPLANT
DRAPE LAPAROSCOPIC ABDOMINAL (DRAPES) ×2 IMPLANT
ELECT REM PT RETURN 9FT ADLT (ELECTROSURGICAL) ×2
ELECTRODE REM PT RTRN 9FT ADLT (ELECTROSURGICAL) ×1 IMPLANT
GAUZE SPONGE 2X2 8PLY STRL LF (GAUZE/BANDAGES/DRESSINGS) ×1 IMPLANT
GLOVE BIOGEL M 8.0 STRL (GLOVE) ×2 IMPLANT
GOWN STRL NON-REIN LRG LVL3 (GOWN DISPOSABLE) ×2 IMPLANT
GOWN STRL REIN XL XLG (GOWN DISPOSABLE) ×4 IMPLANT
HEMOSTAT SURGICEL 4X8 (HEMOSTASIS) IMPLANT
IV CATH 14GX2 1/4 (CATHETERS) ×2 IMPLANT
KIT BASIN OR (CUSTOM PROCEDURE TRAY) ×2 IMPLANT
NS IRRIG 1000ML POUR BTL (IV SOLUTION) ×2 IMPLANT
POUCH SPECIMEN RETRIEVAL 10MM (ENDOMECHANICALS) ×2 IMPLANT
SCISSORS LAP 5X35 DISP (ENDOMECHANICALS) ×2 IMPLANT
SET IRRIG TUBING LAPAROSCOPIC (IRRIGATION / IRRIGATOR) ×2 IMPLANT
SLEEVE ENDOPATH XCEL 5M (ENDOMECHANICALS) ×2 IMPLANT
SLEEVE Z-THREAD 5X100MM (TROCAR) IMPLANT
SOLUTION ANTI FOG 6CC (MISCELLANEOUS) ×2 IMPLANT
SPONGE GAUZE 2X2 STER 10/PKG (GAUZE/BANDAGES/DRESSINGS) ×1
STRIP CLOSURE SKIN 1/2X4 (GAUZE/BANDAGES/DRESSINGS) IMPLANT
SUT VIC AB 4-0 SH 18 (SUTURE) ×2 IMPLANT
SYR 30ML LL (SYRINGE) ×2 IMPLANT
SYS RETRIEVAL 5MM INZII UNIV (BASKET) ×2
SYSTEM RETRIEVL 5MM INZII UNIV (BASKET) ×1 IMPLANT
TAPE CLOTH SURG 4X10 WHT LF (GAUZE/BANDAGES/DRESSINGS) ×2 IMPLANT
TOWEL OR 17X26 10 PK STRL BLUE (TOWEL DISPOSABLE) ×4 IMPLANT
TRAY LAP CHOLE (CUSTOM PROCEDURE TRAY) ×2 IMPLANT
TROCAR BLADELESS OPT 5 75 (ENDOMECHANICALS) IMPLANT
TROCAR XCEL BLUNT TIP 100MML (ENDOMECHANICALS) IMPLANT
TROCAR XCEL NON-BLD 11X100MML (ENDOMECHANICALS) ×2 IMPLANT
TROCAR XCEL NON-BLD 5MMX100MML (ENDOMECHANICALS) ×4 IMPLANT
TROCAR Z-THREAD FIOS 11X100 BL (TROCAR) IMPLANT
TROCAR Z-THREAD FIOS 5X100MM (TROCAR) IMPLANT
TUBING INSUFFLATION 10FT LAP (TUBING) ×2 IMPLANT

## 2011-06-21 NOTE — Anesthesia Preprocedure Evaluation (Signed)
Anesthesia Evaluation  Patient identified by MRN, date of birth, ID band Patient awake    Reviewed: Allergy & Precautions, H&P , NPO status , Patient's Chart, lab work & pertinent test results  History of Anesthesia Complications (+) PONV  Airway Mallampati: II TM Distance: >3 FB Neck ROM: Full    Dental  (+) Teeth Intact and Dental Advisory Given   Pulmonary neg pulmonary ROS,  breath sounds clear to auscultation  Pulmonary exam normal       Cardiovascular hypertension, Rhythm:Regular Rate:Normal     Neuro/Psych  Neuromuscular disease negative psych ROS   GI/Hepatic negative GI ROS, Neg liver ROS,   Endo/Other  Diabetes mellitus-, Well Controlled, Type 2, Oral Hypoglycemic AgentsHypothyroidism Morbid obesity  Renal/GU negative Renal ROS  negative genitourinary   Musculoskeletal negative musculoskeletal ROS (+)   Abdominal   Peds  Hematology negative hematology ROS (+)   Anesthesia Other Findings   Reproductive/Obstetrics negative OB ROS                           Anesthesia Physical Anesthesia Plan  ASA: III  Anesthesia Plan: General   Post-op Pain Management:    Induction: Intravenous  Airway Management Planned: Oral ETT  Additional Equipment:   Intra-op Plan:   Post-operative Plan: Extubation in OR  Informed Consent: I have reviewed the patients History and Physical, chart, labs and discussed the procedure including the risks, benefits and alternatives for the proposed anesthesia with the patient or authorized representative who has indicated his/her understanding and acceptance.   Dental advisory given  Plan Discussed with: CRNA  Anesthesia Plan Comments:         Anesthesia Quick Evaluation

## 2011-06-21 NOTE — Transfer of Care (Signed)
Immediate Anesthesia Transfer of Care Note  Patient: Jacqueline Ayala. O'Briant  Procedure(s) Performed: Procedure(s) (LRB): LAPAROSCOPIC CHOLECYSTECTOMY WITH INTRAOPERATIVE CHOLANGIOGRAM (N/A)  Patient Location: PACU  Anesthesia Type: General  Level of Consciousness: awake, alert  and oriented  Airway & Oxygen Therapy: Patient Spontanous Breathing and Patient connected to face mask oxygen  Post-op Assessment: Report given to PACU RN and Post -op Vital signs reviewed and stable  Post vital signs: Reviewed and stable  Complications: No apparent anesthesia complications

## 2011-06-21 NOTE — Anesthesia Postprocedure Evaluation (Signed)
Anesthesia Post Note  Patient: Jacqueline Ayala. O'Briant  Procedure(s) Performed: Procedure(s) (LRB): LAPAROSCOPIC CHOLECYSTECTOMY WITH INTRAOPERATIVE CHOLANGIOGRAM (N/A)  Anesthesia type: General  Patient location: PACU  Post pain: Pain level controlled  Post assessment: Post-op Vital signs reviewed  Last Vitals:  Filed Vitals:   06/21/11 1730  BP: 118/47  Pulse: 67  Temp: 36.5 C  Resp: 17    Post vital signs: Reviewed  Level of consciousness: sedated  Complications: No apparent anesthesia complications

## 2011-06-21 NOTE — Preoperative (Signed)
Beta Blockers   Reason not to administer Beta Blockers:Not Applicable 

## 2011-06-21 NOTE — Interval H&P Note (Signed)
History and Physical Interval Note:  06/21/2011 2:16 PM  Jacqueline Ayala  has presented today for surgery, with the diagnosis of gallstones  The various methods of treatment have been discussed with the patient and family. After consideration of risks, benefits and other options for treatment, the patient has consented to  Procedure(s) (LRB): LAPAROSCOPIC CHOLECYSTECTOMY WITH INTRAOPERATIVE CHOLANGIOGRAM (N/A) as a surgical intervention .  The patient's history has been reviewed, patient examined, no change in status, stable for surgery.  I have reviewed the patients' chart and labs.  Questions were answered to the patient's satisfaction.     [name]  There has been no change in the patient's past medical history or physical exam in the past 24 hours to the best of my knowledge. I examined the patient in the holding area and have made any changes to the history and physical exam report that is included above.   Expectations and outcome results have been discussed with the patient to include risks and benefits.  All questions have been answered and we will proceed with previously discussed procedure noted and signed in the consent form in the patient's record.    Javari Bufkin BMD FACS 2:16 PM  06/21/2011

## 2011-06-21 NOTE — Anesthesia Procedure Notes (Signed)
Procedure Name: Intubation Date/Time: 06/21/2011 2:37 PM Performed by: Leroy Libman L Patient Re-evaluated:Patient Re-evaluated prior to inductionOxygen Delivery Method: Circle system utilized Preoxygenation: Pre-oxygenation with 100% oxygen Intubation Type: IV induction Ventilation: Mask ventilation without difficulty and Oral airway inserted - appropriate to patient size Laryngoscope Size: Hyacinth Meeker and 2 Grade View: Grade I Tube type: Oral Tube size: 7.5 mm Number of attempts: 1 Airway Equipment and Method: Stylet Placement Confirmation: ETT inserted through vocal cords under direct vision,  breath sounds checked- equal and bilateral and positive ETCO2 Secured at: 22 cm Tube secured with: Tape Dental Injury: Teeth and Oropharynx as per pre-operative assessment

## 2011-06-21 NOTE — Op Note (Signed)
Jacqueline Ayala @date @  Procedure: Laparoscopic Cholecystectomy with intraoperative cholangiogram  Surgeon: Wenda Low, MD, FACS Asst:  none  Anes:  General  Drains: None  Findings: Bilobed GB (pharygian cap), chronic cholecystitis, multiple cholesterol stone with fragments in cystic duct and possible distal CBD obstruction from debris  Description of Procedure: The patient was taken to OR 1 and given general anesthesia.  The patient was prepped with PCMX and draped sterilely. A time out was performed.  Access to the abdomen was achieved with 5 mm Optiview through the RUQ.  Port placement included 3 5mm trocars and an 11 in the upper midline.    The gallbladder was visualized and the fundus was grasped and the gallbladder was elevated. Traction on the infundibulum allowed for successful demonstration of the critical view. Inflammatory changes were chronic.  The cystic duct was identified and clipped up on the gallbladder and an incision was made in the cystic duct and the Reddick catheter was inserted after milking the cystic duct of any debris. A dynamic cholangiogram was performed which demonstrated intrahepatic and extrahepatic filling but no flow into the duodenum.    The cystic duct was then triple clipped and divided, the cystic artery was double clipped and divided and then the gallbladder was removed from the gallbladder bed. Removal of the gallbladder from the gallbladder bed was tedious but did not enter the GB and no stone spillage occurred.  The gallbladder was then placed in a bag and brought out through one of the 10 mm trocar sites. The gallbladder bed was inspected and no bleeding or bile leaks were seen.   Laparoscopic visualization was used when closing fascial defects for trocar sites. The upper midline was closed with 0 Vicryl and endoclose   Incisions were injected with Exparel and closed with 4-0 Vicryl and Dermabond on the skin.  Sponge and needle count were correct.     The patient was taken to the recovery room in satisfactory condition.

## 2011-06-21 NOTE — Progress Notes (Signed)
Pt did fleets enema HS with good results

## 2011-06-21 NOTE — H&P (View-Only) (Signed)
Chief Complaint:  RUQ pain and gallstones  History of Present Illness:  Jacqueline Ayala is an 46 y.o. female with Charcot Marie Tooth Type 2D who is followed at the MD at [facility].  She has been Having upper abdominal pain after eating fatty foods. To her credit she has been on a diet and lost a lot of weight in that was probably the origin of her gallstones. She is followed by Dr. Conservation officer, historic buildings.  I discussed laparoscopic cholecystectomy with her including its complications not limited to, bile duct injury bile leak or injury to surrounding bowel. She is aware of the risk. She wants to proceed with cholecystectomy.  Past Medical History  Diagnosis Date  . Diverticulitis   . Gallstones   . Arthritis   . Hyperlipidemia   . Hypertension   . Neuromuscular disorder   . Thyroid disease     Past Surgical History  Procedure Date  . Nose surgery     removal of bone spur  . Wisdom tooth extraction     Current Outpatient Prescriptions  Medication Sig Dispense Refill  . BIOTIN PO Take by mouth.      . calcium-vitamin D (OSCAL WITH D) 500-200 MG-UNIT per tablet Take 1 tablet by mouth daily.      . fish oil-omega-3 fatty acids 1000 MG capsule Take 2 g by mouth daily.      Marland Kitchen levothyroxine (SYNTHROID, LEVOTHROID) 175 MCG tablet Take 175 mcg by mouth daily.      Marland Kitchen loratadine (CLARITIN) 10 MG tablet Take 10 mg by mouth daily.      . metFORMIN (GLUCOPHAGE) 1000 MG tablet Take 1,000 mg by mouth 2 (two) times daily with a meal.      . Multiple Vitamin (MULITIVITAMIN WITH MINERALS) TABS Take 1 tablet by mouth daily.      . ramipril (ALTACE) 5 MG capsule Take 5 mg by mouth daily.       Codeine and Ibuprofen Family History  Problem Relation Age of Onset  . Heart disease Father    Social History:   reports that she has never smoked. She does not have any smokeless tobacco history on file. She reports that she does not drink alcohol or use illicit drugs.   REVIEW OF SYSTEMS - PERTINENT POSITIVES ONLY: CMT  as mentioned above.  This is demyelinating disease of the distal limbs  Physical Exam:   Blood pressure 134/90, pulse 89, temperature 98 F (36.7 C), temperature source Temporal, height 5' 7.5" (1.715 m), weight 290 lb 6.4 oz (131.725 kg), SpO2 97.00%. Body mass index is 44.81 kg/(m^2).  Gen:  WDWN WF NAD  Neurological: Alert and oriented to person, place, and time. Motor and sensory function is grossly intact  Head: Normocephalic and atraumatic.  Eyes: Conjunctivae are normal. Pupils are equal, round, and reactive to light. No scleral icterus.  Neck: Normal range of motion. Neck supple. No tracheal deviation or thyromegaly present.  Cardiovascular:  SR without murmurs or gallops.  No carotid bruits Respiratory: Effort normal.  No respiratory distress. No chest wall tenderness. Breath sounds normal.  No wheezes, rales or rhonchi.  Abdomen:  Obese but nontender GU: Musculoskeletal: Normal range of motion. Extremities are nontender. No cyanosis, edema or clubbing noted Lymphadenopathy: No cervical, preauricular, postauricular or axillary adenopathy is present Skin: Skin is warm and dry. No rash noted. No diaphoresis. No erythema. No pallor. Pscyh: Normal mood and affect. Behavior is normal. Judgment and thought content normal.   LABORATORY RESULTS: No results  found for this or any previous visit (from the past 48 hour(s)).  RADIOLOGY RESULTS: No results found.  Problem List: Patient Active Problem List  Diagnosis  . HIGH BLOOD PRESSURE  . Hypothyroidism  . Hyperlipemia  . Arthritis  . Charcot-Marie-Tooth disease  . Seasonal allergies  . Diverticulitis    Assessment & Plan: Gallstones (symptomatic)  Lap chole at [facility] B. Daphine Deutscher, MD, [facility] [phone] beeper [phone]  06/15/2011 10:56 AM

## 2011-06-22 LAB — COMPREHENSIVE METABOLIC PANEL
ALT: 78 U/L — ABNORMAL HIGH (ref 0–35)
AST: 63 U/L — ABNORMAL HIGH (ref 0–37)
Alkaline Phosphatase: 98 U/L (ref 39–117)
CO2: 27 mEq/L (ref 19–32)
Chloride: 97 mEq/L (ref 96–112)
GFR calc Af Amer: >90 mL/min (ref >90)
GFR calc non Af Amer: >90 mL/min (ref >90)
Glucose, Bld: 137 mg/dL — ABNORMAL HIGH (ref 70–99)
Potassium: 4.5 mEq/L (ref 3.5–5.1)
Sodium: 134 mEq/L — ABNORMAL LOW (ref 135–145)
Total Bilirubin: 0.3 mg/dL (ref 0.3–1.2)

## 2011-06-22 LAB — GLUCOSE, CAPILLARY
Glucose-Capillary: 133 mg/dL — ABNORMAL HIGH (ref 70–99)
Glucose-Capillary: 119 mg/dL — ABNORMAL HIGH (ref 70–99)

## 2011-06-22 MED ORDER — OXYCODONE-ACETAMINOPHEN 5-325 MG PO TABS: 1.0000 | ORAL_TABLET | ORAL | Status: AC | PRN

## 2011-06-22 NOTE — Progress Notes (Signed)
Discharge instructions and prescription provided. Patient verbalized understanding. Patient discharged to home.

## 2011-06-22 NOTE — Discharge Instructions (Signed)
Venous Thromboembolism, Prevention A venous thromboembolism is a blood clot that forms in a vein. A blood clot in a deep vein is called a deep venous thrombosis (DVT). A blood clot in the lungs is called a pulmonary embolism (PE). Blood clots are dangerous and can cause death. Blood clots can form in the:  Lungs.   Legs.   Arms.  CAUSES Blood clots can form in a vein from not moving (immobility) or poor blood circulation. When blood is not circulated well and blood "pools" in a vein, clotting factors in the blood can form a blood clot. Conditions that may increase your risk of a blood clot are:  Recent orthopedic or general surgery such as hip, knee or belly surgery.   Sit or lie still for over one hour during travel or are confined to a bed or chair most of the time. (for example, during long distance travel, paralysis, or recovery from an illness or an operation).   Have had a stroke, heart attack, heart failure or are paralyzed.   Have a broken a bone such as a leg, hip or pelvis.   Have had cancer or are being treated for it.   Have a history of blood clots or have a family history of blood clots.   Take hormones, especially for birth control or hormone replacement therapy.   Are overweight (obese).   Are over 60 years of age or are female.   Smoke.   Have an implanted vascular access device.   Have blood circulation problems.  SYMPTOMS Symptoms of blood clot depend on where the clot is located:   Signs of a clot in a leg or arm vein may include:   Swelling of the leg or arm, especially on one side.   Warmth and redness of the leg or arm.   Pain in an arm or leg (worse when standing or walking).   Signs of a clot in the lungs can include:   Difficulty breathing or shortness of breath.   Chest pain. Pain is often worse with deep breaths.   Coughing.   Coughing up blood or blood tinged phlegm.   Rapid heartbeat.   Fainting.  A blood clot in the lungs (PE) is  a medical emergency. Get immediate help if you have problems breathing.  PREVENTION  Exercise regularly. Take a brisk 30 minute walk every day. Staying active and moving around can help prevent blood clots.   Avoid sitting or lying in bed for long periods of time. Change your position often, especially during a long trip.   Women, especially those over the age of 13, should consider the risks and benefits of taking estrogen medications. This includes birth control pills and hormone replacement therapy.   Do not smoke, especially if you take estrogen medications. If you smoke, talk to your caregiver on how to quit.   Eat plenty of fruits and vegetables. Ask your caregiver or dietician if there are foods you should avoid.   Maintain a weight as suggested by your caregiver.   Wear loose-fitting clothing. Avoid constrictive or tight clothing around your legs or waist.   Try not to bump or injure your legs. Avoid crossing your legs when you are sitting.   Do not use pillows under your knees unless told by your caregiver.   Take all medicines that your caregiver prescribes you.   Wear special stockings (compression stockings or TED hose) if your caregiver prescribes them.   Wearing compression stockings (support  hose) can make the leg veins more narrow. This increases blood flow in the legs and can help prevent blood clots.   It is important to wear compression stockings correctly. Do not let them bunch up when you are wearing them.  TRAVEL Long distance travel can increase the risk of a blood clot. To prevent a blood clot when traveling:  You should exercise your legs by walking or by pumping your muscles every hour. To help prevent poor circulation on long trips, stand, stretch, and walk up and down the aisle of your airplane, train or bus as often as possible to get the blood moving.   Do squats if you are able. If you are unable to do squats, raise your foot on the balls of your feet  and tighten your lower leg muscles (particularly the calve muscles) while seated. Pointing (flexing and extending) your toes while tightening your calves while seated are also good exercises to do every hour during long trips. They help increase blood flow and reduce risk of DVT.   Stay well hydrated. Drink water regularly when traveling, especially when you are sitting or immobile for long periods of time.   Use of drugs to prevent DVT during routine travel is not generally recommended. Before taking any drugs to reduce risk of DVT, consult your caregiver.  SURGERY AND HOSPITALIZATION  People who are at high risk for a blood clot may be given a blood thinning medication when they are hospitalized even if they are not going to have surgery.   A long trip prior to surgery can increase the risk of a clot for patients undergoing hip and knee replacements. Talk to your caregiver about travel plans before your surgery.   After hip or knee surgery, your caregiver may give you blood thinners to help prevent blood clots.   Blood thinning medications (anticoagulant's) may be given to people at high risk of developing thromboembolism, before, during or sometimes after surgery, including people with clotting disorders or with a history of past thromboembolism.  TRAVEL AFTER SURGERY  In orthopedic surgery, the cutting of bones prompts the body to increase clotting factors in the blood. Due to the size of the bones involved in hip and knee replacements, there is a higher risk of blood clotting than other orthopedic surgeries.   There is a risk of clotting for up to 4-6 weeks after surgery. Flying or traveling long distances can increase your risk of a clot. As a result, those who travel long distances may need additional preventive measures after their procedure.   Drink only non-alcoholic beverages during your flight, train or car travel. Alcohol can dehydrate you and increase your risk of getting blood  clots.  SEEK IMMEDIATE MEDICAL CARE IF:   You develop chest pain.   You develop severe shortness of breath.   You have breathing problems after traveling.   You develop swelling or pain in the leg.   You begin to cough up bloody mucus or phlegm (sputum).   You feel dizzy or faint.  Document Released: 12/07/2008 Document Revised: 12/08/2010 Document Reviewed: 12/07/2008 [facility] Patient Information 2012 [place], [place].Jaundice Jaundice is a yellowish discoloration of the skin, whites of the eyes, and mucous membranes. It is caused by increased levels of bilirubin in the blood (hyperbilirubinemia). Bilirubin is produced by the normal breakdown of red blood cells. Jaundice may mean the liver or bile system is not working normally. CAUSES  The most common causes include: Viral hepatitis.  Gallstones.  Excess use of alcohol.  Liver disease.  Certain cancers.  SYMPTOMS  Yellow color to the skin, whites of the eyes, or mucous membranes.  Dark brown colored urine.  Stomach pain.  Light or clay colored stool.  Itchy skin.  DIAGNOSIS  Your history will be taken along with a physical exam.  Urine and blood tests.  Abdominal ultrasound.  CT scans.  MRI.  Liver biopsy if the liver disease is suspected.  Endoscopic retrograde cholangiopancreatography (ERCP).  TREATMENT  Treatment depends on the cause or related to the treatment of an underlying condition. For example, if jaundice is caused by gallstones, the stones or gallbladder may need to be removed. Other treatments may include: Rest.  Stopping a certain medicine if it is causing the jaundice.  Giving fluid through the vein (IV fluids).  Surgery (removing gallstones, cancers).  Some conditions that cause jaundice can be fatal if not treated. HOME CARE INSTRUCTIONS  Rest.  Drink enough fluids to keep your urine clear or pale yellow.  Avoid all alcoholic drinks.  Only take over-the-counter or prescription medicines for nausea,  vomiting, itching, pain, discomfort, or fever as directed by your caregiver.  If jaundice is due to viral hepatitis or an infection:  Avoid close contact with people.  Avoid preparing food for others.  Avoid sharing utensils with others.  Wash your hands often.  Keep all follow-up appointments with your caregiver.  Use skin lotions to relieve itching.  SEEK IMMEDIATE MEDICAL CARE IF:  You have increased pain.  You have repeated vomiting.  You become dehydrated.  You have a fever or persistent symptoms for more than 72 hours.  You have a fever and your symptoms suddenly get worse.  You become weak or confused.  You develop a severe headache.  MAKE SURE YOU:  Understand these instructions.  Will watch your condition.  Will get help right away if you are not doing well or get worse.  Document Released: 12/19/2004 Document Revised: 12/08/2010 Document Reviewed: 12/03/2009 [facility] Patient Information 2012 [place], [place].Laparoscopic Cholecystectomy Laparoscopic cholecystectomy is surgery to remove the gallbladder. The gallbladder is located slightly to the right of center in the abdomen, behind the liver. It is a concentrating and storage sac for the bile produced in the liver. Bile aids in the digestion and absorption of fats. Gallbladder disease (cholecystitis) is an inflammation of your gallbladder. This condition is usually caused by a buildup of gallstones (cholelithiasis) in your gallbladder. Gallstones can block the flow of bile, resulting in inflammation and pain. In severe cases, emergency surgery may be required. When emergency surgery is not required, you will have time to prepare for the procedure. Laparoscopic surgery is an alternative to open surgery. Laparoscopic surgery usually has a shorter recovery time. Your common bile duct may also need to be examined and explored. Your caregiver will discuss this with you if he or she feels this should be done. If stones are found in the  common bile duct, they may be removed. LET YOUR CAREGIVER KNOW ABOUT:  Allergies to food or medicine.   Medicines taken, including vitamins, herbs, eyedrops, over-the-counter medicines, and creams.   Use of steroids (by mouth or creams).   Previous problems with anesthetics or numbing medicines.   History of bleeding problems or blood clots.   Previous surgery.   Other health problems, including diabetes and kidney problems.   Possibility of pregnancy, if this applies.  RISKS AND COMPLICATIONS All surgery is associated with risks. Some problems that may occur  following this procedure include:  Infection.   Damage to the common bile duct, nerves, arteries, veins, or other internal organs such as the stomach or intestines.   Bleeding.   A stone may remain in the common bile duct.  BEFORE THE PROCEDURE  Do not take aspirin for 3 days prior to surgery or blood thinners for 1 week prior to surgery.   Do not eat or drink anything after midnight the night before surgery.   Let your caregiver know if you develop a cold or other infectious problem prior to surgery.   You should be present 60 minutes before the procedure or as directed.  PROCEDURE  You will be given medicine that makes you sleep (general anesthetic). When you are asleep, your surgeon will make several small cuts (incisions) in your abdomen. One of these incisions is used to insert a small, lighted scope (laparoscope) into the abdomen. The laparoscope helps the surgeon see into your abdomen. Carbon dioxide gas will be pumped into your abdomen. The gas allows more room for the surgeon to perform your surgery. Other operating instruments are inserted through the other incisions. Laparoscopic procedures may not be appropriate when:  There is major scarring from previous surgery.   The gallbladder is extremely inflamed.   There are bleeding disorders or unexpected cirrhosis of the liver.   A pregnancy is near term.     Other conditions make the laparoscopic procedure impossible.  If your surgeon feels it is not safe to continue with a laparoscopic procedure, he or she will perform an open abdominal procedure. In this case, the surgeon will make an incision to open the abdomen. This gives the surgeon a larger view and field to work within. This may allow the surgeon to perform procedures that sometimes cannot be performed with a laparoscope alone. Open surgery has a longer recovery time. AFTER THE PROCEDURE  You will be taken to the recovery area where a nurse will watch and check your progress.   You may be allowed to go home the same day.   Do not resume physical activities until directed by your caregiver.   You may resume a normal diet and activities as directed.  Document Released: 12/19/2004 Document Revised: 12/08/2010 Document Reviewed: 06/03/2010 [facility] Patient Information 2012 [place], [place].

## 2011-06-22 NOTE — Discharge Summary (Signed)
Physician Discharge Summary  Patient ID: Jacqueline Ayala. O'Briant MRN: [id number] DOB/AGE: [id number] y.o.  Admit date: 06/21/2011 Discharge date: 06/22/2011  Admission Diagnoses:  cholecystitis  Discharge Diagnoses:  Chronic cholecystitis  Active Problems:  * No active hospital problems. *    Surgery:  Laparoscopic cholecystectomy with IOC  Discharged Condition: good  Hospital Course:   Had surgery and IOC didn't empty and she had small stones in her cystic duct.  Patient apprised of this and told to watch for dark urine or jaundice.  PD 1 lab showed normal bilirubin, VSS and no abdominal complaints  Consults: none Significant Diagnostic Studies: IOC   Discharge Exam: Blood pressure 114/63, pulse 70, temperature 97.5 F (36.4 C), temperature source Oral, resp. rate 18, height 5' 7.5" (1.715 m), weight 290 lb (131.543 kg), SpO2 97.00%. Incisions OK.  No pain Disposition: 01-Home or Self Care  Discharge Orders    Future Orders Please Complete By Expires   Diet - low sodium heart healthy      Increase activity slowly      Discharge instructions      Comments:   Watch for jaundice or dark urine   No dressing needed        Medication List  As of 06/22/2011  7:53 AM   TAKE these medications         BIOTIN PO   Take 5,000 mcg by mouth daily with breakfast.      calcium-vitamin D 500-200 MG-UNIT per tablet   Commonly known as: OSCAL WITH D   Take 1 tablet by mouth daily with breakfast.      fish oil-omega-3 fatty acids 1000 MG capsule   Take 1,200 mg by mouth daily.      ibuprofen 200 MG tablet   Commonly known as: ADVIL,MOTRIN   Take 200 mg by mouth every 6 (six) hours as needed. For pain      levothyroxine 175 MCG tablet   Commonly known as: SYNTHROID, LEVOTHROID   Take 175 mcg by mouth daily with breakfast. Patient takes Synthroid.      loratadine 10 MG tablet   Commonly known as: CLARITIN   Take 10 mg by mouth daily as needed. Seasonal allergies      metFORMIN  1000 MG tablet   Commonly known as: GLUCOPHAGE   Take 1,000 mg by mouth 2 (two) times daily with a meal. Extended Release      multivitamin with minerals Tabs   Take 1 tablet by mouth daily.      oxyCODONE-acetaminophen 5-325 MG per tablet   Commonly known as: PERCOCET   Take 1-2 tablets by mouth every 4 (four) hours as needed.      ramipril 5 MG capsule   Commonly known as: ALTACE   Take 5 mg by mouth daily with breakfast.           Follow-up Information    Follow up with Luretha Murphy B, MD in 4 weeks.   Contact information:   [employer] [address], [address] [place] [address] [phone]          Signed: Valarie Merino 06/22/2011, 7:53 AM

## 2011-06-23 ENCOUNTER — Encounter (HOSPITAL_COMMUNITY): Payer: Self-pay | Admitting: Surgery

## 2011-06-30 ENCOUNTER — Encounter (INDEPENDENT_AMBULATORY_CARE_PROVIDER_SITE_OTHER): Payer: Self-pay | Admitting: Surgery

## 2011-06-30 ENCOUNTER — Ambulatory Visit (INDEPENDENT_AMBULATORY_CARE_PROVIDER_SITE_OTHER): Payer: Medicare Other | Admitting: Surgery

## 2011-06-30 VITALS — BP 142/72 | HR 95 | Temp 98.0°F | Ht 67.5 in | Wt 288.2 lb

## 2011-06-30 DIAGNOSIS — Z9889 Other specified postprocedural states: Secondary | ICD-10-CM

## 2011-06-30 DIAGNOSIS — Z9049 Acquired absence of other specified parts of digestive tract: Secondary | ICD-10-CM

## 2011-06-30 NOTE — Patient Instructions (Signed)
Laparoscopic Cholecystectomy Care After Refer to this sheet in the next few weeks. These instructions provide you with information on caring for yourself after your procedure. Your caregiver may also give you more specific instructions. Your treatment has been planned according to current medical practices, but problems sometimes occur. Call your caregiver if you have any problems or questions after your procedure. HOME CARE INSTRUCTIONS   Change bandages (dressings) as directed by your caregiver.   Keep the wound dry and clean. The wound may be washed gently with soap and water. Gently blot or dab the area dry.   Do not take baths or use swimming pools or hot tubs for 10 days, or as instructed by your caregiver.   Only take over-the-counter or prescription medicines for pain, discomfort, or fever as directed by your caregiver.   Continue your normal diet as directed by your caregiver.   Do not lift anything heavier than 25 pounds (11.5 kg), or as directed by your caregiver.   Do not play contact sports for 1 week, or as directed by your caregiver.  SEEK MEDICAL CARE IF:   There is redness, swelling, or increasing pain in the wound.   You notice yellowish-white fluid (pus) coming from the wound.   There is drainage from the wound that lasts longer than 1 day.   There is a bad smell coming from the wound or dressing.   The surgical cut (incision) breaks open.  SEEK IMMEDIATE MEDICAL CARE IF:   You develop a rash.   You have difficulty breathing.   You develop chest pain.   You develop any reaction or side effects to medicines given.   You have a fever.   You have increasing pain in the shoulders (shoulder strap areas).   You have dizzy episodes or faint while standing.   You develop severe abdominal pain.   You feel sick to your stomach (nauseous) or throw up (vomit) and this lasts for more than 1 day.  MAKE SURE YOU:   Understand these instructions.   Will watch  your condition.   Will get help right away if you are not doing well or get worse.  Document Released: 12/19/2004 Document Revised: 12/08/2010 Document Reviewed: 06/03/2010 [facility] Patient Information 2012 [place], [place].  Thanks for your patience.  If you need further assistance after leaving the office, please call our office and speak with French Ana A.  [phone].  If you want to leave a message for Dr. Daphine Deutscher, please call his office phone at [phone].

## 2011-06-30 NOTE — Progress Notes (Signed)
Jacqueline Ayala 47 y.o.  Body mass index is 44.47 kg/(m^2).  Patient Active Problem List  Diagnosis  . HIGH BLOOD PRESSURE  . Hypothyroidism  . Hyperlipemia  . Arthritis  . Charcot-Marie-Tooth disease-Type 2D  . Seasonal allergies  . Diverticulitis  . Gallstones  . S/P laparoscopic cholecystectomy    Allergies  Allergen Reactions  . Codeine Nausea And Vomiting  . Ibuprofen     Upset stomach with long term use    Past Surgical History  Procedure Date  . Nose surgery     removal of bone spur  . Wisdom tooth extraction   . Cholecystectomy 06/21/2011    Procedure: LAPAROSCOPIC CHOLECYSTECTOMY WITH INTRAOPERATIVE CHOLANGIOGRAM;  Surgeon: Valarie Merino, MD;  Location: [facility];  Service: General;  Laterality: N/A;   Katy Apo, MD 1. S/P laparoscopic cholecystectomy     Incisions are OK.  Answered questions.  Doing well.  Return prn Matt B. Daphine Deutscher, MD, [facility] [phone] beeper [phone]  06/30/2011 2:27 PM

## 2011-07-12 ENCOUNTER — Encounter (INDEPENDENT_AMBULATORY_CARE_PROVIDER_SITE_OTHER): Payer: Self-pay

## 2011-08-02 ENCOUNTER — Encounter (INDEPENDENT_AMBULATORY_CARE_PROVIDER_SITE_OTHER): Payer: Medicare Other | Admitting: Surgery

## 2011-08-04 ENCOUNTER — Encounter (INDEPENDENT_AMBULATORY_CARE_PROVIDER_SITE_OTHER): Payer: Self-pay | Admitting: Surgery

## 2012-09-12 ENCOUNTER — Ambulatory Visit (HOSPITAL_COMMUNITY)
Admission: RE | Admit: 2012-09-12 | Discharge: 2012-09-12 | Disposition: A | Discharge status: Home / Self Care | Payer: Medicare Other | Source: Ambulatory Visit | Attending: Orthopedic Surgery | Admitting: Orthopedic Surgery

## 2012-09-12 ENCOUNTER — Other Ambulatory Visit (HOSPITAL_COMMUNITY): Payer: Self-pay | Admitting: Orthopedic Surgery

## 2012-09-12 DIAGNOSIS — M1610 Unilateral primary osteoarthritis, unspecified hip: Secondary | ICD-10-CM | POA: Insufficient documentation

## 2012-09-12 DIAGNOSIS — M169 Osteoarthritis of hip, unspecified: Secondary | ICD-10-CM

## 2012-09-12 DIAGNOSIS — R262 Difficulty in walking, not elsewhere classified: Secondary | ICD-10-CM | POA: Insufficient documentation

## 2015-05-14 ENCOUNTER — Other Ambulatory Visit (HOSPITAL_COMMUNITY): Payer: Self-pay | Admitting: Respiratory Therapy

## 2015-05-14 DIAGNOSIS — R0602 Shortness of breath: Secondary | ICD-10-CM

## 2015-05-28 ENCOUNTER — Ambulatory Visit (HOSPITAL_COMMUNITY)
Admission: RE | Admit: 2015-05-28 | Discharge: 2015-05-28 | Disposition: A | Discharge status: Home / Self Care | Payer: Medicare Other | Source: Ambulatory Visit | Attending: Internal Medicine | Admitting: Internal Medicine

## 2015-05-28 ENCOUNTER — Other Ambulatory Visit (HOSPITAL_COMMUNITY): Payer: Self-pay | Admitting: Adult Health Nurse Practitioner

## 2015-05-28 ENCOUNTER — Ambulatory Visit (HOSPITAL_COMMUNITY)
Admission: RE | Admit: 2015-05-28 | Discharge: 2015-05-28 | Disposition: A | Discharge status: Home / Self Care | Payer: Medicare Other | Source: Ambulatory Visit | Attending: Adult Health Nurse Practitioner | Admitting: Adult Health Nurse Practitioner

## 2015-05-28 DIAGNOSIS — R0602 Shortness of breath: Secondary | ICD-10-CM

## 2015-05-28 LAB — PULMONARY FUNCTION TEST
DL/VA % PRED: 123 %
DL/VA: 6.13 ml/min/mmHg/L
DLCO UNC % PRED: 74 %
DLCO unc: 19.57 ml/min/mmHg
FEF 25-75 POST: 2.7 L/s
FEF 25-75 PRE: 1.98 L/s
FEF2575-%CHANGE-POST: 36 %
FEF2575-%PRED-POST: 92 %
FEF2575-%PRED-PRE: 68 %
FEV1-%CHANGE-POST: 7 %
FEV1-%Pred-Post: 64 %
FEV1-%Pred-Pre: 60 %
FEV1-Post: 1.93 L
FEV1-Pre: 1.79 L
FEV1FVC-%Change-Post: -1 %
FEV1FVC-%PRED-PRE: 105 %
FEV6-%CHANGE-POST: 8 %
FEV6-%PRED-POST: 63 %
FEV6-%Pred-Pre: 58 %
FEV6-Post: 2.32 L
FEV6-Pre: 2.13 L
FEV6FVC-%Pred-Post: 102 %
FEV6FVC-%Pred-Pre: 102 %
FVC-%CHANGE-POST: 8 %
FVC-%PRED-POST: 61 %
FVC-%Pred-Pre: 56 %
FVC-Post: 2.32 L
FVC-Pre: 2.13 L
POST FEV1/FVC RATIO: 83 %
PRE FEV1/FVC RATIO: 84 %
PRE FEV6/FVC RATIO: 100 %
Post FEV6/FVC ratio: 100 %
RV % pred: 132 %
RV: 2.42 L
TLC % pred: 86 %
TLC: 4.56 L

## 2015-05-28 MED ORDER — ALBUTEROL SULFATE (2.5 MG/3ML) 0.083% IN NEBU
2.5000 mg | INHALATION_SOLUTION | Freq: Once | RESPIRATORY_TRACT | Status: AC
  Administered 2015-05-28: 2.5 mg via RESPIRATORY_TRACT

## 2015-10-06 ENCOUNTER — Ambulatory Visit (INDEPENDENT_AMBULATORY_CARE_PROVIDER_SITE_OTHER): Payer: Medicare Other | Admitting: Orthopaedic Surgery

## 2015-10-06 DIAGNOSIS — M1611 Unilateral primary osteoarthritis, right hip: Secondary | ICD-10-CM | POA: Diagnosis not present

## 2015-11-05 ENCOUNTER — Telehealth (INDEPENDENT_AMBULATORY_CARE_PROVIDER_SITE_OTHER): Payer: Self-pay | Admitting: Orthopaedic Surgery

## 2015-11-05 NOTE — Telephone Encounter (Signed)
Left voice mail for patient to call me back to discuss scheduling surgery.

## 2015-11-15 NOTE — Telephone Encounter (Signed)
Spoke with patient today and scheduled surgery. 

## 2015-12-06 ENCOUNTER — Encounter (HOSPITAL_COMMUNITY): Payer: Self-pay | Admitting: *Deleted

## 2015-12-08 ENCOUNTER — Ambulatory Visit (INDEPENDENT_AMBULATORY_CARE_PROVIDER_SITE_OTHER): Payer: Medicare Other

## 2015-12-08 ENCOUNTER — Ambulatory Visit (INDEPENDENT_AMBULATORY_CARE_PROVIDER_SITE_OTHER): Payer: Medicare Other | Admitting: Physician Assistant

## 2015-12-08 ENCOUNTER — Other Ambulatory Visit (INDEPENDENT_AMBULATORY_CARE_PROVIDER_SITE_OTHER): Payer: Self-pay | Admitting: Physician Assistant

## 2015-12-08 ENCOUNTER — Encounter (INDEPENDENT_AMBULATORY_CARE_PROVIDER_SITE_OTHER): Payer: Self-pay | Admitting: Physician Assistant

## 2015-12-08 DIAGNOSIS — M5442 Lumbago with sciatica, left side: Secondary | ICD-10-CM

## 2015-12-08 MED ORDER — METHOCARBAMOL 500 MG PO TABS: 500.0000 mg | ORAL_TABLET | Freq: Three times a day (TID) | ORAL | 1 refills | Status: DC

## 2015-12-08 MED ORDER — METHYLPREDNISOLONE 4 MG PO TABS: ORAL_TABLET | ORAL | 0 refills | Status: DC

## 2015-12-08 MED ORDER — MELOXICAM 15 MG PO TABS: 15.0000 mg | ORAL_TABLET | Freq: Every day | ORAL | 2 refills | Status: DC

## 2015-12-08 NOTE — Progress Notes (Signed)
Office Visit Note   Patient: Jacqueline Ayala           Date of Birth: 02/10/1965           MRN: [id number] Visit Date: 12/08/2015              Requested by: Benita StabileJohn Z Hall, MD [address] [place], [place] [address] PCP: Dwana MelenaZack Hall, MD   Assessment & Plan: Visit Diagnoses:  1. Acute left-sided low back pain with left-sided sciatica     Plan: Medrol Dosepak and Robaxin once finished with the Dosepak she can resume her Mobic. Back exercises reviewed with patient on a handout handout is given. Moist deep back is encouraged. She'll call our office if her back pain is not improving to determine if we should proceed with her right total hip arthroplasty which is scheduled for 10/01/2015.  Follow-Up Instructions: No Follow-up on file.   Orders:  Orders Placed This Encounter  Procedures  . XR Lumbar Spine 2-3 Views   Meds ordered this encounter  Medications  . methylPREDNISolone (MEDROL) 4 MG tablet    Sig: TAKE AS DIRECTED    Dispense:  21 tablet    Refill:  0  . methocarbamol (ROBAXIN) 500 MG tablet    Sig: Take 1 tablet (500 mg total) by mouth 3 (three) times daily.    Dispense:  40 tablet    Refill:  1  . meloxicam (MOBIC) 15 MG tablet    Sig: Take 1 tablet (15 mg total) by mouth daily.    Dispense:  30 tablet    Refill:  2      Procedures: No procedures performed   Clinical Data: No additional findings.   Subjective: No chief complaint on file.   Patient states that last Wednesday she started having back pain with pinching and burning, pain is radiating into left leg.  Numbness/tingling in left foot. States pain is still there.  Ambulates with a walker. She's had no injuries states that the pain began last Wednesday night IN  her back  and then by Saturday morning she was having radicular symptoms down into her left foot. She denies any change in bowel or bladder function. No Awaking pain.    Review of Systems   Objective: Vital Signs: There were no  vitals taken for this visit.  Physical Exam  Constitutional: She is oriented to person, place, and time. She appears well-developed and well-nourished. No distress.  Cardiovascular: Intact distal pulses.   Pulmonary/Chest: Effort normal.  Neurological: She is alert and oriented to person, place, and time.  Psychiatric: She has a normal mood and affect.    Ortho Exam Positive straight leg raise on the left negative on the right. She has 5 out of 5 strength throughout the lower extremities against resistance except for dorsiflexion inches 3 out of 5(she states this is her baseline). Tenderness in the left lower lumbar region with palpation. Decreased sensation over the left foot laterally and the left lower leg laterally. Specialty Comments:  No specialty comments available.  Imaging: Xr Lumbar Spine 2-3 Views  Result Date: 12/08/2015 Lumbar spine 2 views: No acute fracture. Grade 1 spondylolisthesis at L4-5. Loss of normal lordotic curvature. Endplate spurring of the lower thoracic and upper lumbar spine . No other bony abnormalities.    PMFS History: Patient Active Problem List   Diagnosis Date Noted  . S/P laparoscopic cholecystectomy 06/30/2011  . Gallstones 06/15/2011  . Hypothyroidism 06/08/2011  . Hyperlipemia 06/08/2011  .  Arthritis 06/08/2011  . 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 Seasonal allergies 06/08/2011  . Diverticulitis 06/08/2011  . HIGH BLOOD PRESSURE 07/25/2006   Past Medical History:  Diagnosis Date  . Arthritis   . Diabetes mellitus   . Diverticulitis   . Gallstones   . Hyperlipidemia   . Hypertension   . Hypothyroidism   . Neuromuscular disorder (HCC)    charco-marie tooth disease   . PONV (postoperative nausea and vomiting)   . Thyroid disease     Family History  Problem Relation Age of Onset  . Heart disease Father   . Other Mother     Past Surgical History:  Procedure Laterality Date  . CHOLECYSTECTOMY  06/21/2011    Procedure: LAPAROSCOPIC CHOLECYSTECTOMY WITH INTRAOPERATIVE CHOLANGIOGRAM;  Surgeon: Valarie MerinoMatthew B Martin, MD;  Location: [facility];  Service: General;  Laterality: N/A;  . NOSE SURGERY     removal of bone spur  . WISDOM TOOTH EXTRACTION     Social History   Occupational History  . Not on file.   Social History Main Topics  . Smoking status: Never Smoker  . Smokeless tobacco: Never Used  . Alcohol use No  . Drug use: No  . Sexual activity: Yes    Birth control/ protection: None

## 2015-12-23 NOTE — Patient Instructions (Addendum)
Jacqueline SilviusCandace M Ayala  12/23/2015   Your procedure is scheduled on: 12-31-15  [facility]  [facility] to 3rd floor to  Short Stay Center at 0530 AM.  Call this number if you have problems the morning of surgery [phone]   Remember: ONLY 1 PERSON MAY GO WITH YOU TO SHORT STAY TO GET  READY MORNING OF YOUR SURGERY.  Do not eat food or drink liquids :After Midnight.     Take these medicines the morning of surgery with A SIP OF WATER: Levothyroxine.Cipro -if indicated. Ranitidine. Tramadol. DO NOT TAKE ANY DIABETIC MEDICATIONS DAY OF YOUR SURGERY                               You may not have any metal on your body including hair pins and              piercings  Do not wear jewelry, make-up, lotions, powders or perfumes, deodorant             Do not wear nail polish.  Do not shave  48 hours prior to surgery.              Men may shave face and neck.   Do not bring valuables to the hospital. [facility] IS NOT             RESPONSIBLE   FOR VALUABLES.  Contacts, dentures or bridgework may not be worn into surgery.  Leave suitcase in the car. After surgery it may be brought to your room.     Patients discharged the day of surgery will not be allowed to drive home.  Name and phone number of your driver: Jacqueline LeatherwoodJason-spouse [phone] cell  Special Instructions: N/A              Please read over the following fact sheets you were given: _____________________________________________________________________             [facility] - Preparing for Surgery Before surgery, you can play an important role.  Because skin is not sterile, your skin needs to be as free of germs as possible.  You can reduce the number of germs on your skin by washing with CHG (chlorahexidine gluconate) soap before surgery.  CHG is an antiseptic cleaner which kills germs and bonds with the skin to continue killing germs even after washing. Please DO NOT use if  you have an allergy to CHG or antibacterial soaps.  If your skin becomes reddened/irritated stop using the CHG and inform your nurse when you arrive at Short Stay. Do not shave (including legs and underarms) for at least 48 hours prior to the first CHG shower.  You may shave your face/neck. Please follow these instructions carefully:  1.  Shower with CHG Soap the night before surgery and the  morning of Surgery.  2.  If you choose to wash your hair, wash your hair first as usual with your  normal  shampoo.  3.  After you shampoo, rinse your hair and body thoroughly to remove the  shampoo.                           4.  Use CHG as you would any other liquid soap.  You can apply chg directly  to the skin and wash                       Gently with a scrungie or clean washcloth.  5.  Apply the CHG Soap to your body ONLY FROM THE NECK DOWN.   Do not use on face/ open                           Wound or open sores. Avoid contact with eyes, ears mouth and genitals (private parts).                       Wash face,  Genitals (private parts) with your normal soap.             6.  Wash thoroughly, paying special attention to the area where your surgery  will be performed.  7.  Thoroughly rinse your body with warm water from the neck down.  8.  DO NOT shower/wash with your normal soap after using and rinsing off  the CHG Soap.                9.  Pat yourself dry with a clean towel.            10.  Wear clean pajamas.            11.  Place clean sheets on your bed the night of your first shower and do not  sleep with pets. Day of Surgery : Do not apply any lotions/deodorants the morning of surgery.  Please wear clean clothes to the hospital/surgery center.  FAILURE TO FOLLOW THESE INSTRUCTIONS MAY RESULT IN THE CANCELLATION OF YOUR SURGERY  ________________________________________________________________________    How to Manage Your Diabetes Before and After Surgery  Why is it important to control my  blood sugar before and after surgery? . Improving blood sugar levels before and after surgery helps healing and can limit problems. . A way of improving blood sugar control is eating a healthy diet by: o  Eating less sugar and carbohydrates o  Increasing activity/exercise o  Talking with your doctor about reaching your blood sugar goals . High blood sugars (greater than 180 mg/dL) can raise your risk of infections and slow your recovery, so you will need to focus on controlling your diabetes during the weeks before surgery. . Make sure that the doctor who takes care of your diabetes knows about your planned surgery including the date and location.  How do I manage my blood sugar before surgery? . Check your blood sugar at least 4 times a day, starting 2 days before surgery, to make sure that the level is not too high or low. o Check your blood sugar the morning of your surgery when you wake up and every 2 hours until you get to the Short Stay unit. . If your blood sugar is less than 70 mg/dL, you will need to treat for low blood sugar: o Do not take insulin. o Treat a low blood sugar (less than 70 mg/dL) with  cup of clear juice (cranberry or apple), 4 glucose tablets, OR glucose gel. o Recheck blood sugar in 15 minutes after treatment (to make sure it is greater than 70 mg/dL). If your blood sugar is not greater than 70 mg/dL on recheck, call [phone] for further instructions. . Report your blood sugar to the short stay nurse when you get to Short Stay.  Marland Kitchen  If you are admitted to the hospital after surgery: o Your blood sugar will be checked by the staff and you will probably be given insulin after surgery (instead of oral diabetes medicines) to make sure you have good blood sugar levels. o The goal for blood sugar control after surgery is 80-180 mg/dL.   WHAT DO I DO ABOUT MY DIABETES MEDICATION?  Marland Kitchen. Do not take oral diabetes medicines (pills) the morning of surgery.   Patient  Signature:  Date:   Nurse Signature:  Date:   Reviewed and Endorsed by [facility] Patient Education Committee, August 2015  Incentive Spirometer  An incentive spirometer is a tool that can help keep your lungs clear and active. This tool measures how well you are filling your lungs with each breath. Taking long deep breaths may help reverse or decrease the chance of developing breathing (pulmonary) problems (especially infection) following:  A long period of time when you are unable to move or be active. BEFORE THE PROCEDURE   If the spirometer includes an indicator to show your best effort, your nurse or respiratory therapist will set it to a desired goal.  If possible, sit up straight or lean slightly forward. Try not to slouch.  Hold the incentive spirometer in an upright position. INSTRUCTIONS FOR USE  1. Sit on the edge of your bed if possible, or sit up as far as you can in bed or on a chair. 2. Hold the incentive spirometer in an upright position. 3. Breathe out normally. 4. Place the mouthpiece in your mouth and seal your lips tightly around it. 5. Breathe in slowly and as deeply as possible, raising the piston or the ball toward the top of the column. 6. Hold your breath for 3-5 seconds or for as long as possible. Allow the piston or ball to fall to the bottom of the column. 7. Remove the mouthpiece from your mouth and breathe out normally. 8. Rest for a few seconds and repeat Steps 1 through 7 at least 10 times every 1-2 hours when you are awake. Take your time and take a few normal breaths between deep breaths. 9. The spirometer may include an indicator to show your best effort. Use the indicator as a goal to work toward during each repetition. 10. After each set of 10 deep breaths, practice coughing to be sure your lungs are clear. If you have an incision (the cut made at the time of surgery), support your incision when coughing by placing a pillow or rolled up towels firmly  against it. Once you are able to get out of bed, walk around indoors and cough well. You may stop using the incentive spirometer when instructed by your caregiver.  RISKS AND COMPLICATIONS  Take your time so you do not get dizzy or light-headed.  If you are in pain, you may need to take or ask for pain medication before doing incentive spirometry. It is harder to take a deep breath if you are having pain. AFTER USE  Rest and breathe slowly and easily.  It can be helpful to keep track of a log of your progress. Your caregiver can provide you with a simple table to help with this. If you are using the spirometer at home, follow these instructions: SEEK MEDICAL CARE IF:   You are having difficultly using the spirometer.  You have trouble using the spirometer as often as instructed.  Your pain medication is not giving enough relief while using the spirometer.  You develop fever of 100.5 F (38.1 C) or higher. SEEK IMMEDIATE MEDICAL CARE IF:   You cough up bloody sputum that had not been present before.  You develop fever of 102 F (38.9 C) or greater.  You develop worsening pain at or near the incision site. MAKE SURE YOU:   Understand these instructions.  Will watch your condition.  Will get help right away if you are not doing well or get worse. Document Released: 05/01/2006 Document Revised: 03/13/2011 Document Reviewed: 07/02/2006 [facility] Patient Information 2014 [place], [place].   ________________________________________________________________________

## 2015-12-24 ENCOUNTER — Encounter (HOSPITAL_COMMUNITY): Payer: Self-pay

## 2015-12-24 ENCOUNTER — Encounter (HOSPITAL_COMMUNITY)
Admission: RE | Admit: 2015-12-24 | Discharge: 2015-12-24 | Disposition: A | Discharge status: Home / Self Care | Payer: Medicare Other | Source: Ambulatory Visit | Attending: Orthopaedic Surgery | Admitting: Orthopaedic Surgery

## 2015-12-24 DIAGNOSIS — K5792 Diverticulitis of intestine, part unspecified, without perforation or abscess without bleeding: Secondary | ICD-10-CM | POA: Insufficient documentation

## 2015-12-24 DIAGNOSIS — E119 Type 2 diabetes mellitus without complications: Secondary | ICD-10-CM | POA: Insufficient documentation

## 2015-12-24 DIAGNOSIS — R51 Headache: Secondary | ICD-10-CM | POA: Diagnosis not present

## 2015-12-24 DIAGNOSIS — M1990 Unspecified osteoarthritis, unspecified site: Secondary | ICD-10-CM | POA: Diagnosis not present

## 2015-12-24 DIAGNOSIS — J302 Other seasonal allergic rhinitis: Secondary | ICD-10-CM | POA: Diagnosis not present

## 2015-12-24 DIAGNOSIS — E785 Hyperlipidemia, unspecified: Secondary | ICD-10-CM | POA: Insufficient documentation

## 2015-12-24 DIAGNOSIS — Z01812 Encounter for preprocedural laboratory examination: Secondary | ICD-10-CM | POA: Diagnosis present

## 2015-12-24 DIAGNOSIS — Z01810 Encounter for preprocedural cardiovascular examination: Secondary | ICD-10-CM | POA: Insufficient documentation

## 2015-12-24 DIAGNOSIS — E039 Hypothyroidism, unspecified: Secondary | ICD-10-CM | POA: Diagnosis not present

## 2015-12-24 DIAGNOSIS — I10 Essential (primary) hypertension: Secondary | ICD-10-CM | POA: Diagnosis not present

## 2015-12-24 DIAGNOSIS — I252 Old myocardial infarction: Secondary | ICD-10-CM | POA: Diagnosis not present

## 2015-12-24 HISTORY — DX: Headache, unspecified: R51.9

## 2015-12-24 HISTORY — DX: Allergy status to unspecified drugs, medicaments and biological substances: Z88.9

## 2015-12-24 HISTORY — DX: Headache: R51

## 2015-12-24 LAB — BASIC METABOLIC PANEL
Anion gap: 9 (ref 5–15)
BUN: 15 mg/dL (ref 6–20)
CALCIUM: 8.9 mg/dL (ref 8.9–10.3)
CO2: 31 mmol/L (ref 22–32)
Chloride: 97 mmol/L — ABNORMAL LOW (ref 101–111)
Creatinine, Ser: 0.67 mg/dL (ref 0.44–1.00)
GFR calc Af Amer: >60 mL/min (ref >60)
GLUCOSE: 101 mg/dL — AB (ref 65–99)
POTASSIUM: 4.1 mmol/L (ref 3.5–5.1)
Sodium: 137 mmol/L (ref 135–145)

## 2015-12-24 LAB — ABO/RH: ABO/RH(D): AB POS

## 2015-12-24 LAB — SURGICAL PCR SCREEN
MRSA, PCR: INVALID — AB
STAPHYLOCOCCUS AUREUS: INVALID — AB

## 2015-12-24 LAB — CBC
HCT: 39.4 % (ref 36.0–46.0)
Hemoglobin: 12.6 g/dL (ref 12.0–15.0)
MCH: 29.6 pg (ref 26.0–34.0)
MCHC: 32 g/dL (ref 30.0–36.0)
MCV: 92.7 fL (ref 78.0–100.0)
Platelets: 361 10*3/uL (ref 150–400)
RBC: 4.25 MIL/uL (ref 3.87–5.11)
RDW: 14.3 % (ref 11.5–15.5)
WBC: 9.7 10*3/uL (ref 4.0–10.5)

## 2015-12-24 LAB — HCG, SERUM, QUALITATIVE: Preg, Serum: NEGATIVE

## 2015-12-24 LAB — GLUCOSE, CAPILLARY: Glucose-Capillary: 94 mg/dL (ref 65–99)

## 2015-12-24 NOTE — Pre-Procedure Instructions (Signed)
EKG done today.

## 2015-12-25 LAB — HEMOGLOBIN A1C
Hgb A1c MFr Bld: 5.9 % — ABNORMAL HIGH (ref 4.8–5.6)
MEAN PLASMA GLUCOSE: 123 mg/dL

## 2015-12-26 LAB — MRSA CULTURE: CULTURE: NOT DETECTED

## 2015-12-30 MED ORDER — DEXTROSE 5 % IV SOLN
3.0000 g | INTRAVENOUS | Status: AC
  Administered 2015-12-31: 3 g via INTRAVENOUS
  Filled 2015-12-30: qty 3

## 2015-12-30 MED ORDER — CEFAZOLIN SODIUM 10 G IJ SOLR
3.0000 g | INTRAMUSCULAR | Status: DC
  Filled 2015-12-30: qty 3000

## 2015-12-30 NOTE — Anesthesia Preprocedure Evaluation (Signed)
Anesthesia Evaluation  Patient identified by MRN, date of birth, ID band Patient awake    Reviewed: Allergy & Precautions, H&P , NPO status , Patient's Chart, lab work & pertinent test results  History of Anesthesia Complications (+) PONV and history of anesthetic complications  Airway Mallampati: II  TM Distance: >3 FB Neck ROM: Full    Dental  (+) Teeth Intact, Dental Advisory Given   Pulmonary neg pulmonary ROS,    Pulmonary exam normal breath sounds clear to auscultation       Cardiovascular hypertension, Normal cardiovascular exam Rhythm:Regular Rate:Normal     Neuro/Psych  Headaches,  Neuromuscular disease negative psych ROS   GI/Hepatic negative GI ROS, Neg liver ROS,   Endo/Other  diabetes, Well Controlled, Type 2, Oral Hypoglycemic AgentsHypothyroidism Morbid obesity  Renal/GU negative Renal ROS  negative genitourinary   Musculoskeletal  (+) Arthritis ,   Abdominal (+) + obese,   Peds  Hematology negative hematology ROS (+)   Anesthesia Other Findings   Reproductive/Obstetrics negative OB ROS                             Anesthesia Physical  Anesthesia Plan  ASA: III  Anesthesia Plan: Spinal   Post-op Pain Management:    Induction:   Airway Management Planned:   Additional Equipment:   Intra-op Plan:   Post-operative Plan:   Informed Consent: I have reviewed the patients History and Physical, chart, labs and discussed the procedure including the risks, benefits and alternatives for the proposed anesthesia with the patient or authorized representative who has indicated his/her understanding and acceptance.   Dental advisory given  Plan Discussed with: CRNA  Anesthesia Plan Comments:         Anesthesia Quick Evaluation

## 2015-12-31 ENCOUNTER — Inpatient Hospital Stay (HOSPITAL_COMMUNITY): Payer: Medicare Other | Admitting: Anesthesiology

## 2015-12-31 ENCOUNTER — Inpatient Hospital Stay (HOSPITAL_COMMUNITY): Payer: Medicare Other

## 2015-12-31 ENCOUNTER — Encounter (HOSPITAL_COMMUNITY): Payer: Self-pay | Admitting: *Deleted

## 2015-12-31 ENCOUNTER — Encounter (HOSPITAL_COMMUNITY)
Admission: RE | Disposition: A | Discharge status: Home Health | Payer: Self-pay | Source: Ambulatory Visit | Attending: Orthopaedic Surgery

## 2015-12-31 ENCOUNTER — Inpatient Hospital Stay (HOSPITAL_COMMUNITY)
Admission: RE | Admit: 2015-12-31 | Discharge: 2016-01-02 | DRG: 470 | Disposition: A | Discharge status: Home Health | Payer: Medicare Other | Source: Ambulatory Visit | Attending: Orthopaedic Surgery | Admitting: Orthopaedic Surgery

## 2015-12-31 DIAGNOSIS — I10 Essential (primary) hypertension: Secondary | ICD-10-CM | POA: Diagnosis present

## 2015-12-31 DIAGNOSIS — Z8249 Family history of ischemic heart disease and other diseases of the circulatory system: Secondary | ICD-10-CM | POA: Diagnosis not present

## 2015-12-31 DIAGNOSIS — Z7984 Long term (current) use of oral hypoglycemic drugs: Secondary | ICD-10-CM | POA: Diagnosis not present

## 2015-12-31 DIAGNOSIS — E119 Type 2 diabetes mellitus without complications: Secondary | ICD-10-CM | POA: Diagnosis present

## 2015-12-31 DIAGNOSIS — E039 Hypothyroidism, unspecified: Secondary | ICD-10-CM | POA: Diagnosis present

## 2015-12-31 DIAGNOSIS — E785 Hyperlipidemia, unspecified: Secondary | ICD-10-CM | POA: Diagnosis present

## 2015-12-31 DIAGNOSIS — M1611 Unilateral primary osteoarthritis, right hip: Principal | ICD-10-CM

## 2015-12-31 DIAGNOSIS — Z9049 Acquired absence of other specified parts of digestive tract: Secondary | ICD-10-CM

## 2015-12-31 DIAGNOSIS — Z79899 Other long term (current) drug therapy: Secondary | ICD-10-CM

## 2015-12-31 DIAGNOSIS — Z7982 Long term (current) use of aspirin: Secondary | ICD-10-CM | POA: Diagnosis not present

## 2015-12-31 DIAGNOSIS — M25551 Pain in right hip: Secondary | ICD-10-CM | POA: Diagnosis present

## 2015-12-31 DIAGNOSIS — Z419 Encounter for procedure for purposes other than remedying health state, unspecified: Secondary | ICD-10-CM

## 2015-12-31 DIAGNOSIS — Z96641 Presence of right artificial hip joint: Secondary | ICD-10-CM

## 2015-12-31 HISTORY — PX: TOTAL HIP ARTHROPLASTY: SHX124

## 2015-12-31 LAB — GLUCOSE, CAPILLARY
GLUCOSE-CAPILLARY: 139 mg/dL — AB (ref 65–99)
Glucose-Capillary: 108 mg/dL — ABNORMAL HIGH (ref 65–99)
Glucose-Capillary: 134 mg/dL — ABNORMAL HIGH (ref 65–99)
Glucose-Capillary: 129 mg/dL — ABNORMAL HIGH (ref 65–99)

## 2015-12-31 LAB — TYPE AND SCREEN
ABO/RH(D): AB POS
Antibody Screen: NEGATIVE

## 2015-12-31 SURGERY — ARTHROPLASTY, HIP, TOTAL, ANTERIOR APPROACH
Anesthesia: Spinal | Site: Hip | Laterality: Right

## 2015-12-31 MED ORDER — HYDROCHLOROTHIAZIDE 25 MG PO TABS
25.0000 mg | ORAL_TABLET | Freq: Every day | ORAL | Status: DC
Start: 2015-12-31 — End: 2016-01-02
  Administered 2015-12-31 – 2016-01-02 (×3): 25 mg via ORAL
  Filled 2015-12-31 (×3): qty 1

## 2015-12-31 MED ORDER — HYDROMORPHONE HCL 1 MG/ML IJ SOLN
INTRAMUSCULAR | Status: AC
  Filled 2015-12-31: qty 1

## 2015-12-31 MED ORDER — ONDANSETRON HCL 4 MG/2ML IJ SOLN
INTRAMUSCULAR | Status: AC
  Filled 2015-12-31: qty 2

## 2015-12-31 MED ORDER — FENTANYL CITRATE (PF) 100 MCG/2ML IJ SOLN
INTRAMUSCULAR | Status: DC | PRN
  Administered 2015-12-31: 50 ug via INTRAVENOUS

## 2015-12-31 MED ORDER — ACETAMINOPHEN 650 MG RE SUPP: 650.0000 mg | Freq: Four times a day (QID) | RECTAL | Status: DC | PRN

## 2015-12-31 MED ORDER — METHOCARBAMOL 500 MG PO TABS
500.0000 mg | ORAL_TABLET | Freq: Four times a day (QID) | ORAL | Status: DC | PRN
  Administered 2015-12-31 – 2016-01-02 (×6): 500 mg via ORAL
  Filled 2015-12-31 (×6): qty 1

## 2015-12-31 MED ORDER — METHOCARBAMOL 1000 MG/10ML IJ SOLN
500.0000 mg | Freq: Four times a day (QID) | INTRAVENOUS | Status: DC | PRN
  Administered 2015-12-31: 500 mg via INTRAVENOUS
  Filled 2015-12-31: qty 550
  Filled 2015-12-31: qty 5

## 2015-12-31 MED ORDER — PROPOFOL 10 MG/ML IV BOLUS
INTRAVENOUS | Status: DC | PRN
  Administered 2015-12-31 (×5): 20 mg via INTRAVENOUS

## 2015-12-31 MED ORDER — OXYCODONE HCL 5 MG PO TABS
5.0000 mg | ORAL_TABLET | ORAL | Status: DC | PRN
  Administered 2015-12-31: 15:00:00 10 mg via ORAL
  Administered 2015-12-31: 5 mg via ORAL
  Administered 2015-12-31: 10 mg via ORAL
  Administered 2015-12-31: 5 mg via ORAL
  Administered 2015-12-31 – 2016-01-02 (×11): 10 mg via ORAL
  Filled 2015-12-31: qty 1
  Filled 2015-12-31 (×5): qty 2
  Filled 2015-12-31: qty 1
  Filled 2015-12-31 (×3): qty 2
  Filled 2015-12-31: qty 1
  Filled 2015-12-31 (×3): qty 2
  Filled 2015-12-31: qty 1
  Filled 2015-12-31: qty 2

## 2015-12-31 MED ORDER — SODIUM CHLORIDE 0.9 % IV SOLN
INTRAVENOUS | Status: DC
  Administered 2015-12-31: 75 mL/h via INTRAVENOUS
  Administered 2016-01-01: 01:00:00 via INTRAVENOUS

## 2015-12-31 MED ORDER — METFORMIN HCL ER 500 MG PO TB24
1000.0000 mg | ORAL_TABLET | Freq: Two times a day (BID) | ORAL | Status: DC
  Administered 2016-01-01 – 2016-01-02 (×3): 1000 mg via ORAL
  Filled 2015-12-31 (×3): qty 2

## 2015-12-31 MED ORDER — ZOLPIDEM TARTRATE 5 MG PO TABS: 5.0000 mg | ORAL_TABLET | Freq: Every evening | ORAL | Status: DC | PRN

## 2015-12-31 MED ORDER — FENTANYL CITRATE (PF) 100 MCG/2ML IJ SOLN
INTRAMUSCULAR | Status: AC
  Filled 2015-12-31: qty 2

## 2015-12-31 MED ORDER — SODIUM CHLORIDE 0.9 % IR SOLN
Status: DC | PRN
  Administered 2015-12-31: 1000 mL

## 2015-12-31 MED ORDER — ALUM & MAG HYDROXIDE-SIMETH 200-200-20 MG/5ML PO SUSP: 30.0000 mL | ORAL | Status: DC | PRN

## 2015-12-31 MED ORDER — PHENOL 1.4 % MT LIQD: 1.0000 | OROMUCOSAL | Status: DC | PRN

## 2015-12-31 MED ORDER — PROPOFOL 10 MG/ML IV BOLUS
INTRAVENOUS | Status: AC
  Filled 2015-12-31: qty 20

## 2015-12-31 MED ORDER — LEVOTHYROXINE SODIUM 125 MCG PO TABS
250.0000 ug | ORAL_TABLET | Freq: Every day | ORAL | Status: DC
  Administered 2016-01-01 – 2016-01-02 (×2): 250 ug via ORAL
  Filled 2015-12-31 (×2): qty 2

## 2015-12-31 MED ORDER — METOCLOPRAMIDE HCL 5 MG PO TABS: 5.0000 mg | ORAL_TABLET | Freq: Three times a day (TID) | ORAL | Status: DC | PRN

## 2015-12-31 MED ORDER — DEXTROSE 5 % IV SOLN
INTRAVENOUS | Status: DC | PRN
  Administered 2015-12-31: 40 ug/min via INTRAVENOUS

## 2015-12-31 MED ORDER — MIDAZOLAM HCL 5 MG/5ML IJ SOLN
INTRAMUSCULAR | Status: DC | PRN
  Administered 2015-12-31: 2 mg via INTRAVENOUS

## 2015-12-31 MED ORDER — METOCLOPRAMIDE HCL 5 MG/ML IJ SOLN
5.0000 mg | Freq: Three times a day (TID) | INTRAMUSCULAR | Status: DC | PRN
  Administered 2015-12-31: 10 mg via INTRAVENOUS
  Filled 2015-12-31: qty 2

## 2015-12-31 MED ORDER — MIDAZOLAM HCL 2 MG/2ML IJ SOLN
INTRAMUSCULAR | Status: AC
  Filled 2015-12-31: qty 2

## 2015-12-31 MED ORDER — MEPERIDINE HCL 50 MG/ML IJ SOLN: 6.2500 mg | INTRAMUSCULAR | Status: DC | PRN

## 2015-12-31 MED ORDER — HYDROMORPHONE HCL 1 MG/ML IJ SOLN
0.2500 mg | INTRAMUSCULAR | Status: DC | PRN
  Administered 2015-12-31 (×3): 0.5 mg via INTRAVENOUS
  Administered 2015-12-31: 0.25 mg via INTRAVENOUS

## 2015-12-31 MED ORDER — INSULIN ASPART 100 UNIT/ML ~~LOC~~ SOLN: 0.0000 [IU] | Freq: Every day | SUBCUTANEOUS | Status: DC

## 2015-12-31 MED ORDER — LORATADINE 10 MG PO TABS: 10.0000 mg | ORAL_TABLET | Freq: Every day | ORAL | Status: DC | PRN

## 2015-12-31 MED ORDER — PHENYLEPHRINE HCL 10 MG/ML IJ SOLN
INTRAMUSCULAR | Status: AC
  Filled 2015-12-31: qty 1

## 2015-12-31 MED ORDER — STERILE WATER FOR IRRIGATION IR SOLN
Status: DC | PRN
  Administered 2015-12-31: 2000 mL

## 2015-12-31 MED ORDER — MENTHOL 3 MG MT LOZG
1.0000 | LOZENGE | OROMUCOSAL | Status: DC | PRN
Start: 2015-12-31 — End: 2016-01-02

## 2015-12-31 MED ORDER — ACETAMINOPHEN 325 MG PO TABS
650.0000 mg | ORAL_TABLET | Freq: Four times a day (QID) | ORAL | Status: DC | PRN
  Administered 2016-01-01 – 2016-01-02 (×4): 650 mg via ORAL
  Filled 2015-12-31 (×4): qty 2

## 2015-12-31 MED ORDER — HYDROMORPHONE HCL 1 MG/ML IJ SOLN: 1.0000 mg | INTRAMUSCULAR | Status: DC | PRN

## 2015-12-31 MED ORDER — PROPOFOL 10 MG/ML IV BOLUS
INTRAVENOUS | Status: AC
  Filled 2015-12-31: qty 60

## 2015-12-31 MED ORDER — PROPOFOL 500 MG/50ML IV EMUL
INTRAVENOUS | Status: DC | PRN
  Administered 2015-12-31: 75 ug/kg/min via INTRAVENOUS

## 2015-12-31 MED ORDER — ONDANSETRON HCL 4 MG/2ML IJ SOLN: 4.0000 mg | Freq: Four times a day (QID) | INTRAMUSCULAR | Status: DC | PRN

## 2015-12-31 MED ORDER — BUPIVACAINE HCL (PF) 0.5 % IJ SOLN
INTRAMUSCULAR | Status: AC
  Filled 2015-12-31: qty 30

## 2015-12-31 MED ORDER — VALSARTAN-HYDROCHLOROTHIAZIDE 160-25 MG PO TABS: 1.0000 | ORAL_TABLET | Freq: Every morning | ORAL | Status: DC

## 2015-12-31 MED ORDER — DIPHENHYDRAMINE HCL 12.5 MG/5ML PO ELIX: 12.5000 mg | ORAL_SOLUTION | ORAL | Status: DC | PRN

## 2015-12-31 MED ORDER — DOCUSATE SODIUM 100 MG PO CAPS
100.0000 mg | ORAL_CAPSULE | Freq: Two times a day (BID) | ORAL | Status: DC
  Administered 2015-12-31 – 2016-01-02 (×4): 100 mg via ORAL
  Filled 2015-12-31 (×4): qty 1

## 2015-12-31 MED ORDER — CEFAZOLIN SODIUM-DEXTROSE 2-4 GM/100ML-% IV SOLN
2.0000 g | Freq: Four times a day (QID) | INTRAVENOUS | Status: AC
  Administered 2015-12-31 (×2): 2 g via INTRAVENOUS
  Filled 2015-12-31 (×2): qty 100

## 2015-12-31 MED ORDER — INSULIN ASPART 100 UNIT/ML ~~LOC~~ SOLN
0.0000 [IU] | Freq: Three times a day (TID) | SUBCUTANEOUS | Status: DC
  Administered 2016-01-01: 3 [IU] via SUBCUTANEOUS

## 2015-12-31 MED ORDER — ONDANSETRON HCL 4 MG PO TABS
4.0000 mg | ORAL_TABLET | Freq: Four times a day (QID) | ORAL | Status: DC | PRN
  Administered 2015-12-31 (×2): 4 mg via ORAL
  Filled 2015-12-31 (×3): qty 1

## 2015-12-31 MED ORDER — POLYETHYLENE GLYCOL 3350 17 G PO PACK: 17.0000 g | PACK | Freq: Every day | ORAL | Status: DC | PRN

## 2015-12-31 MED ORDER — LACTATED RINGERS IV SOLN
INTRAVENOUS | Status: DC | PRN
  Administered 2015-12-31 (×2): via INTRAVENOUS

## 2015-12-31 MED ORDER — FAMOTIDINE 20 MG PO TABS
20.0000 mg | ORAL_TABLET | Freq: Two times a day (BID) | ORAL | Status: DC
  Administered 2015-12-31 – 2016-01-02 (×4): 20 mg via ORAL
  Filled 2015-12-31 (×4): qty 1

## 2015-12-31 MED ORDER — IRBESARTAN 150 MG PO TABS
150.0000 mg | ORAL_TABLET | Freq: Every day | ORAL | Status: DC
Start: 2015-12-31 — End: 2016-01-02
  Administered 2015-12-31 – 2016-01-02 (×3): 150 mg via ORAL
  Filled 2015-12-31 (×3): qty 1

## 2015-12-31 MED ORDER — ASPIRIN 81 MG PO CHEW
81.0000 mg | CHEWABLE_TABLET | Freq: Two times a day (BID) | ORAL | Status: DC
  Administered 2015-12-31 – 2016-01-02 (×4): 81 mg via ORAL
  Filled 2015-12-31 (×4): qty 1

## 2015-12-31 MED ORDER — PROMETHAZINE HCL 25 MG/ML IJ SOLN: 6.2500 mg | INTRAMUSCULAR | Status: DC | PRN

## 2015-12-31 MED ORDER — ONDANSETRON HCL 4 MG/2ML IJ SOLN
INTRAMUSCULAR | Status: DC | PRN
  Administered 2015-12-31: 4 mg via INTRAVENOUS

## 2015-12-31 MED ORDER — BUPIVACAINE HCL (PF) 0.5 % IJ SOLN
INTRAMUSCULAR | Status: DC | PRN
  Administered 2015-12-31: 3 mL via INTRATHECAL

## 2015-12-31 SURGICAL SUPPLY — 45 items
BAG ZIPLOCK 12X15 (MISCELLANEOUS) ×3 IMPLANT
BENZOIN TINCTURE PRP APPL 2/3 (GAUZE/BANDAGES/DRESSINGS) IMPLANT
BLADE SAW SGTL 18X1.27X75 (BLADE) ×2 IMPLANT
BLADE SAW SGTL 18X1.27X75MM (BLADE) ×1
CAPT HIP TOTAL 2 ×3 IMPLANT
CELLS DAT CNTRL 66122 CELL SVR (MISCELLANEOUS) ×1 IMPLANT
CLOSURE WOUND 1/2 X4 (GAUZE/BANDAGES/DRESSINGS)
CLOTH BEACON ORANGE TIMEOUT ST (SAFETY) ×3 IMPLANT
COVER PERINEAL POST (MISCELLANEOUS) ×3 IMPLANT
DRAPE STERI IOBAN 125X83 (DRAPES) ×3 IMPLANT
DRAPE U-SHAPE 47X51 STRL (DRAPES) ×6 IMPLANT
DRSG AQUACEL AG ADV 3.5X10 (GAUZE/BANDAGES/DRESSINGS) ×3 IMPLANT
DURAPREP 26ML APPLICATOR (WOUND CARE) ×3 IMPLANT
ELECT REM PT RETURN 9FT ADLT (ELECTROSURGICAL) ×3
ELECTRODE REM PT RTRN 9FT ADLT (ELECTROSURGICAL) ×1 IMPLANT
GAUZE XEROFORM 1X8 LF (GAUZE/BANDAGES/DRESSINGS) ×3 IMPLANT
GLOVE BIO SURGEON STRL SZ7.5 (GLOVE) ×3 IMPLANT
GLOVE BIOGEL PI IND STRL 6.5 (GLOVE) ×1 IMPLANT
GLOVE BIOGEL PI IND STRL 7.0 (GLOVE) ×1 IMPLANT
GLOVE BIOGEL PI IND STRL 7.5 (GLOVE) ×2 IMPLANT
GLOVE BIOGEL PI IND STRL 8 (GLOVE) ×2 IMPLANT
GLOVE BIOGEL PI INDICATOR 6.5 (GLOVE) ×2
GLOVE BIOGEL PI INDICATOR 7.0 (GLOVE) ×2
GLOVE BIOGEL PI INDICATOR 7.5 (GLOVE) ×4
GLOVE BIOGEL PI INDICATOR 8 (GLOVE) ×4
GLOVE ECLIPSE 8.0 STRL XLNG CF (GLOVE) ×3 IMPLANT
GLOVE SURG SS PI 6.5 STRL IVOR (GLOVE) ×3 IMPLANT
GLOVE SURG SS PI 7.5 STRL IVOR (GLOVE) ×3 IMPLANT
GOWN STRL REUS W/TWL LRG LVL3 (GOWN DISPOSABLE) ×3 IMPLANT
GOWN STRL REUS W/TWL XL LVL3 (GOWN DISPOSABLE) ×9 IMPLANT
HANDPIECE INTERPULSE COAX TIP (DISPOSABLE) ×2
HOLDER FOLEY CATH W/STRAP (MISCELLANEOUS) ×3 IMPLANT
PACK ANTERIOR HIP CUSTOM (KITS) ×3 IMPLANT
RTRCTR WOUND ALEXIS 18CM MED (MISCELLANEOUS) ×3
SET HNDPC FAN SPRY TIP SCT (DISPOSABLE) ×1 IMPLANT
STAPLER VISISTAT 35W (STAPLE) IMPLANT
STRIP CLOSURE SKIN 1/2X4 (GAUZE/BANDAGES/DRESSINGS) IMPLANT
SUT ETHIBOND NAB CT1 #1 30IN (SUTURE) ×3 IMPLANT
SUT MNCRL AB 4-0 PS2 18 (SUTURE) IMPLANT
SUT VIC AB 0 CT1 36 (SUTURE) ×3 IMPLANT
SUT VIC AB 1 CT1 36 (SUTURE) ×3 IMPLANT
SUT VIC AB 2-0 CT1 27 (SUTURE) ×4
SUT VIC AB 2-0 CT1 TAPERPNT 27 (SUTURE) ×2 IMPLANT
TRAY FOLEY CATH SILVER 14FR (SET/KITS/TRAYS/PACK) ×3 IMPLANT
YANKAUER SUCT BULB TIP 10FT TU (MISCELLANEOUS) ×3 IMPLANT

## 2015-12-31 NOTE — Progress Notes (Signed)
Pt has noted bleeding to right middle toe.. Pt stated "i bumped it this am" cleaned with soap and water . Band-aid applied.

## 2015-12-31 NOTE — Anesthesia Postprocedure Evaluation (Signed)
Anesthesia Post Note  Patient: Jacqueline Ayala  Procedure(s) Performed: Procedure(s) (LRB): RIGHT TOTAL HIP ARTHROPLASTY ANTERIOR APPROACH (Right)  Patient location during evaluation: PACU Anesthesia Type: Spinal Level of consciousness: awake and alert Pain management: pain level controlled Vital Signs Assessment: post-procedure vital signs reviewed and stable Respiratory status: spontaneous breathing and respiratory function stable Cardiovascular status: blood pressure returned to baseline and stable Postop Assessment: spinal receding Anesthetic complications: no       Last Vitals:  Vitals:   12/31/15 1145 12/31/15 1248  BP: 103/80 (!) 155/51  Pulse: (!) 108 (!) 110  Resp: 20 20  Temp: 36.6 C 36.8 C    Last Pain:  Vitals:   12/31/15 1248  TempSrc: Oral  PainSc:                  Lewie LoronJohn Akanksha Bellmore

## 2015-12-31 NOTE — H&P (Signed)
TOTAL HIP ADMISSION H&P  Patient is admitted for right total hip arthroplasty.  Subjective:  Chief Complaint: right hip pain  HPI: Jacqueline Ayala, 50 y.o. female, has a history of pain and functional disability in the right hip(s) due to arthritis and patient has failed non-surgical conservative treatments for greater than 12 weeks to include NSAID's and/or analgesics, corticosteriod injections, use of assistive devices, weight reduction as appropriate and activity modification.  Onset of symptoms was gradual starting 5 years ago with gradually worsening course since that time.The patient noted no past surgery on the right hip(s).  Patient currently rates pain in the right hip at 10 out of 10 with activity. Patient has night pain, worsening of pain with activity and weight bearing, trendelenberg gait, pain that interfers with activities of daily living and pain with passive range of motion. Patient has evidence of subchondral sclerosis, periarticular osteophytes and joint space narrowing by imaging studies. This condition presents safety issues increasing the risk of falls.  There is no current active infection.  Patient Active Problem List   Diagnosis Date Noted  . Unilateral primary osteoarthritis, right hip 12/31/2015  . S/P laparoscopic cholecystectomy 06/30/2011  . Gallstones 06/15/2011  . Hypothyroidism 06/08/2011  . Hyperlipemia 06/08/2011  . Arthritis 06/08/2011  . 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 Seasonal allergies 06/08/2011  . Diverticulitis 06/08/2011  . HIGH BLOOD PRESSURE 07/25/2006   Past Medical History:  Diagnosis Date  . Arthritis    osteoarthritis -back, right hip, knees.  . Diabetes mellitus   . Diverticulitis   . Gallstones    resolved with surgery  . H/O seasonal allergies   . Headache    not migraine level  . Hyperlipidemia   . Hypertension   . Hypothyroidism   . Neuromuscular disorder (HCC)    charco-marie tooth disease   . PONV  (postoperative nausea and vomiting)   . Thyroid disease     Past Surgical History:  Procedure Laterality Date  . CHOLECYSTECTOMY  06/21/2011   Procedure: LAPAROSCOPIC CHOLECYSTECTOMY WITH INTRAOPERATIVE CHOLANGIOGRAM;  Surgeon: Valarie MerinoMatthew B Martin, MD;  Location: [facility];  Service: General;  Laterality: N/A;  . NOSE SURGERY     removal of bone spur  . WISDOM TOOTH EXTRACTION      Prescriptions Prior to Admission  Medication Sig Dispense Refill Last Dose  . aspirin EC 81 MG tablet Take 81 mg by mouth every evening.   12/24/2015  . aspirin-acetaminophen-caffeine (EXCEDRIN MIGRAINE) 250-250-65 MG tablet Take 2 tablets by mouth daily as needed for headache.   Past Month at Unknown time  . diphenhydrAMINE (BENADRYL) 25 MG tablet Take 50 mg by mouth at bedtime as needed for sleep.   Past Month at Unknown time  . levothyroxine (SYNTHROID, LEVOTHROID) 125 MCG tablet Take 250 mcg by mouth daily before breakfast. Brand name Synthroid   12/31/2015 at 0530  . meloxicam (MOBIC) 15 MG tablet Take 1 tablet (15 mg total) by mouth daily. 30 tablet 2 Past Week at Unknown time  . metFORMIN (GLUCOPHAGE-XR) 500 MG 24 hr tablet Take 1,000 mg by mouth 2 (two) times daily.   12/30/2015 at Unknown time  . methocarbamol (ROBAXIN) 500 MG tablet Take 1 tablet (500 mg total) by mouth 3 (three) times daily. 40 tablet 1 12/30/2015 at Unknown time  . ranitidine (ZANTAC) 150 MG tablet Take 150 mg by mouth 2 (two) times daily.   12/30/2015 at Unknown time  . traMADol (ULTRAM) 50 MG tablet Take 100mg  every 8 hours daily  0 12/30/2015 at Unknown time  . valsartan-hydrochlorothiazide (DIOVAN-HCT) 160-25 MG tablet Take 1 tablet by mouth every morning  4 12/30/2015 at Unknown time  . ciprofloxacin (CIPRO) 500 MG tablet Take 500 mg by mouth 2 (two) times daily. X7 days until completed   12/27/2015  . loratadine (CLARITIN) 10 MG tablet Take 10 mg by mouth daily as needed. Seasonal allergies   More than a month at Unknown time  .  methylPREDNISolone (MEDROL) 4 MG tablet TAKE AS DIRECTED (Patient not taking: Reported on 12/20/2015) 21 tablet 0 Completed Course at Unknown time   Allergies  Allergen Reactions  . Codeine Nausea And Vomiting  . Ibuprofen     Upset stomach with long term use    Social History  Substance Use Topics  . Smoking status: Never Smoker  . Smokeless tobacco: Never Used  . Alcohol use No    Family History  Problem Relation Age of Onset  . Heart disease Father   . Other Mother      Review of Systems  Musculoskeletal: Positive for back pain and joint pain.  All other systems reviewed and are negative.   Objective:  Physical Exam  Constitutional: She is oriented to person, place, and time. She appears well-developed and well-nourished.  HENT:  Head: Normocephalic and atraumatic.  Eyes: EOM are normal. Pupils are equal, round, and reactive to light.  Neck: Normal range of motion. Neck supple.  Cardiovascular: Normal rate and regular rhythm.   Respiratory: Effort normal and breath sounds normal.  GI: Soft. Bowel sounds are normal.  Musculoskeletal:       Right hip: She exhibits decreased range of motion, decreased strength, tenderness and bony tenderness.  Neurological: She is alert and oriented to person, place, and time.  Skin: Skin is warm and dry.  Psychiatric: She has a normal mood and affect.    Vital signs in last 24 hours: Temp:  [98.8 F (37.1 C)] 98.8 F (37.1 C) (12/29 0600) Pulse Rate:  [96] 96 (12/29 0600) Resp:  [18] 18 (12/29 0600) BP: (136)/(74) 136/74 (12/29 0600) SpO2:  [96 %] 96 % (12/29 0600) Weight:  [297 lb (134.7 kg)] 297 lb (134.7 kg) (12/29 0600)  Labs:   Estimated body mass index is 46.52 kg/m as calculated from the following:   Height as of this encounter: 5\' 7"  (1.702 m).   Weight as of this encounter: 297 lb (134.7 kg).   Imaging Review Plain radiographs demonstrate severe degenerative joint disease of the right hip(s). The bone quality  appears to be good for age and reported activity level.  Assessment/Plan:  End stage arthritis, right hip(s)  The patient history, physical examination, clinical judgement of the provider and imaging studies are consistent with end stage degenerative joint disease of the right hip(s) and total hip arthroplasty is deemed medically necessary. The treatment options including medical management, injection therapy, arthroscopy and arthroplasty were discussed at length. The risks and benefits of total hip arthroplasty were presented and reviewed. The risks due to aseptic loosening, infection, stiffness, dislocation/subluxation,  thromboembolic complications and other imponderables were discussed.  The patient acknowledged the explanation, agreed to proceed with the plan and consent was signed. Patient is being admitted for inpatient treatment for surgery, pain control, PT, OT, prophylactic antibiotics, VTE prophylaxis, progressive ambulation and ADL's and discharge planning.The patient is planning to be discharged home with home health services

## 2015-12-31 NOTE — Brief Op Note (Signed)
12/31/2015  9:00 AM  PATIENT:  Jenaveve M O'Briant  50 y.o. female  PRE-OPERATIVE DIAGNOSIS:  Right hip osteoarthritis  POST-OPERATIVE DIAGNOSIS:  Right hip osteoarthritis  PROCEDURE:  Procedure(s): RIGHT TOTAL HIP ARTHROPLASTY ANTERIOR APPROACH (Right)  SURGEON:  Surgeon(s) and Role:    * Kathryne Hitchhristopher Y Cassondra Stachowski, MD - Primary  PHYSICIAN ASSISTANT: Rexene EdisonGil Clark, PA-C  ANESTHESIA:   spinal  EBL:  Total I/O In: 1000 [I.V.:1000] Out: 350 [Urine:150; Blood:200]  COUNTS:  YES  DICTATION: .Other Dictation: Dictation Number [id number]  PLAN OF CARE: Admit to inpatient   PATIENT DISPOSITION:  PACU - hemodynamically stable.   Delay start of Pharmacological VTE agent (>24hrs) due to surgical blood loss or risk of bleeding: no

## 2015-12-31 NOTE — Transfer of Care (Signed)
Immediate Anesthesia Transfer of Care Note  Patient: Jacqueline Ayala  Procedure(s) Performed: Procedure(s): RIGHT TOTAL HIP ARTHROPLASTY ANTERIOR APPROACH (Right)  Patient Location: PACU  Anesthesia Type:Spinal  Level of Consciousness: awake, alert  and oriented  Airway & Oxygen Therapy: Patient Spontanous Breathing and Patient connected to face mask oxygen  Post-op Assessment: Report given to RN and Post -op Vital signs reviewed and stable  Post vital signs: Reviewed and stable  Last Vitals:  Vitals:   12/31/15 0600  BP: 136/74  Pulse: 96  Resp: 18  Temp: 37.1 C    Last Pain: There were no vitals filed for this visit.       Complications: No apparent anesthesia complications

## 2015-12-31 NOTE — Anesthesia Procedure Notes (Signed)
Spinal  Patient location during procedure: OR Start time: 12/31/2015 7:20 AM End time: 12/31/2015 7:26 AM Reason for block: at surgeon's request Staffing Resident/CRNA: Anne Fu Performed: resident/CRNA  Preanesthetic Checklist Completed: patient identified, site marked, surgical consent, pre-op evaluation, timeout performed, IV checked, risks and benefits discussed and monitors and equipment checked Spinal Block Patient position: sitting Prep: DuraPrep Patient monitoring: heart rate, continuous pulse ox and blood pressure Approach: right paramedian Location: L2-3 Injection technique: single-shot Needle Needle type: Pencan  Needle gauge: 24 G Needle length: 9 cm Assessment Sensory level: T6 Additional Notes Expiration date of kit checked and confirmed. Patient tolerated procedure well, without complications. X 1 attempt with noted clear CSF return. Loss of motor and sensory on exam post injection.

## 2015-12-31 NOTE — Evaluation (Signed)
Physical Therapy Evaluation Patient Details Name: Jacqueline Ayala MRN: [id number] DOB: 01/25/1965 Today's Date: 12/31/2015   History of Present Illness  Pt s/p R THR and with hx of DM, and Charcoat foot  Clinical Impression  Pt s/p R THR presents with decreased R LE strength/ROM, post op pain, bil partial drop foot 2* CMT, and body habitus limiting functional mobility.  Pt should progress to dc home with family assist and HHPT follow up.    Follow Up Recommendations Home health PT    Equipment Recommendations  None recommended by PT    Recommendations for Other Services OT consult     Precautions / Restrictions Precautions Precautions: Fall Restrictions Weight Bearing Restrictions: No Other Position/Activity Restrictions: WBAT      Mobility  Bed Mobility Overal bed mobility: Needs Assistance Bed Mobility: Supine to Sit     Supine to sit: Min assist;+2 for physical assistance;+2 for safety/equipment;HOB elevated     General bed mobility comments: cues for sequence and assist to complete transition to EOB  Transfers Overall transfer level: Needs assistance Equipment used: Rolling walker (2 wheeled) Transfers: Sit to/from Stand Sit to Stand: Min assist;+2 physical assistance;+2 safety/equipment;From elevated surface         General transfer comment: cues for LE management and use of UEs to self assist  Ambulation/Gait Ambulation/Gait assistance: Min assist;+2 safety/equipment Ambulation Distance (Feet): 18 Feet Assistive device: Rolling walker (2 wheeled) Gait Pattern/deviations: Step-to pattern;Decreased step length - right;Decreased step length - left;Shuffle;Trunk flexed;Steppage     General Gait Details: cues for sequence, posture and position from RW.  Increased difficulty advancing R LE 2* partial drop foot  Stairs            Wheelchair Mobility    Modified Rankin (Stroke Patients Only)       Balance                                              Pertinent Vitals/Pain Pain Assessment: 0-10 Pain Score: 4  Pain Location: R hip Pain Descriptors / Indicators: Aching;Sore Pain Intervention(s): Limited activity within patient's tolerance;Monitored during session;Premedicated before session;Ice applied    Home Living Family/patient expects to be discharged to:: Private residence Living Arrangements: Spouse/significant other Available Help at Discharge: Family Type of Home: House Home Access: Stairs to enter Entrance Stairs-Rails: None Secretary/administratorntrance Stairs-Number of Steps: 2 Home Layout: One level Home Equipment: Environmental consultantWalker - 2 wheels      Prior Function Level of Independence: Independent with assistive device(s)               Hand Dominance        Extremity/Trunk Assessment   Upper Extremity Assessment Upper Extremity Assessment: Overall WFL for tasks assessed    Lower Extremity Assessment Lower Extremity Assessment: LLE deficits/detail;RLE deficits/detail RLE Deficits / Details: Ltd DF 2* CMT; hip assessment deferred to next session LLE Deficits / Details: ltd DF 2* CMT    Cervical / Trunk Assessment Cervical / Trunk Assessment: Normal  Communication   Communication: No difficulties  Cognition Arousal/Alertness: Awake/alert Behavior During Therapy: WFL for tasks assessed/performed Overall Cognitive Status: Within Functional Limits for tasks assessed                      General Comments      Exercises Total Joint Exercises Ankle Circles/Pumps: AROM;Both;15 reps;Supine   Assessment/Plan  PT Assessment Patient needs continued PT services  PT Problem List Decreased strength;Decreased range of motion;Decreased activity tolerance;Decreased mobility;Decreased knowledge of use of DME;Obesity;Pain          PT Treatment Interventions DME instruction;Gait training;Therapeutic activities;Functional mobility training;Stair training;Therapeutic exercise;Patient/family education     PT Goals (Current goals can be found in the Care Plan section)  Acute Rehab PT Goals Patient Stated Goal: Regain IND PT Goal Formulation: With patient Time For Goal Achievement: 01/04/16 Potential to Achieve Goals: Good    Frequency 7X/week   Barriers to discharge        Co-evaluation               End of Session   Activity Tolerance: Patient tolerated treatment well;Patient limited by fatigue Patient left: in chair;with call bell/phone within reach;with nursing/sitter in room Nurse Communication: Mobility status         Time: [id number] PT Time Calculation (min) (ACUTE ONLY): 29 min   Charges:   PT Evaluation $PT Eval Low Complexity: 1 Procedure PT Treatments $Gait Training: 8-22 mins   PT G Codes:        Jeannie Mallinger 12/31/2015, 5:57 PM

## 2016-01-01 LAB — BASIC METABOLIC PANEL
Anion gap: 8 (ref 5–15)
BUN: 11 mg/dL (ref 6–20)
CALCIUM: 8.6 mg/dL — AB (ref 8.9–10.3)
CHLORIDE: 97 mmol/L — AB (ref 101–111)
CO2: 32 mmol/L (ref 22–32)
CREATININE: 0.61 mg/dL (ref 0.44–1.00)
Glucose, Bld: 140 mg/dL — ABNORMAL HIGH (ref 65–99)
Potassium: 3.9 mmol/L (ref 3.5–5.1)
SODIUM: 137 mmol/L (ref 135–145)

## 2016-01-01 LAB — CBC
HCT: 37.6 % (ref 36.0–46.0)
HEMOGLOBIN: 11.7 g/dL — AB (ref 12.0–15.0)
MCH: 28.5 pg (ref 26.0–34.0)
MCHC: 31.1 g/dL (ref 30.0–36.0)
MCV: 91.5 fL (ref 78.0–100.0)
PLATELETS: ADEQUATE 10*3/uL (ref 150–400)
RBC: 4.11 MIL/uL (ref 3.87–5.11)
RDW: 14.1 % (ref 11.5–15.5)
WBC: 11.2 10*3/uL — ABNORMAL HIGH (ref 4.0–10.5)

## 2016-01-01 LAB — GLUCOSE, CAPILLARY
GLUCOSE-CAPILLARY: 117 mg/dL — AB (ref 65–99)
GLUCOSE-CAPILLARY: 119 mg/dL — AB (ref 65–99)
Glucose-Capillary: 125 mg/dL — ABNORMAL HIGH (ref 65–99)
Glucose-Capillary: 112 mg/dL — ABNORMAL HIGH (ref 65–99)

## 2016-01-01 NOTE — Progress Notes (Signed)
Physical Therapy Treatment Patient Details Name: Jacqueline Ayala MRN: [id number] DOB: 11/25/1965 Today's Date: 01/01/2016    History of Present Illness Pt s/p R THR and with hx of DM, and Charcoat foot    PT Comments    Pt motivated but progressing slowly with mobility.  Pt states spouse can bring in shoes and AFO from home - states AFO does not fit well since weight loss.  Follow Up Recommendations  Home health PT     Equipment Recommendations  None recommended by PT    Recommendations for Other Services OT consult     Precautions / Restrictions Precautions Precautions: Fall Restrictions Weight Bearing Restrictions: No Other Position/Activity Restrictions: WBAT    Mobility  Bed Mobility Overal bed mobility: Needs Assistance Bed Mobility: Supine to Sit     Supine to sit: Min assist;+2 for physical assistance;+2 for safety/equipment;HOB elevated     General bed mobility comments: cues for sequence and assist to complete transition to EOB  Transfers Overall transfer level: Needs assistance Equipment used: Rolling walker (2 wheeled) Transfers: Sit to/from Stand Sit to Stand: Min assist;+2 physical assistance;+2 safety/equipment;From elevated surface         General transfer comment: cues for LE management and use of UEs to self assist  Ambulation/Gait Ambulation/Gait assistance: Min assist;+2 safety/equipment Ambulation Distance (Feet): 22 Feet Assistive device: Rolling walker (2 wheeled) Gait Pattern/deviations: Step-to pattern;Decreased step length - right;Decreased step length - left;Shuffle;Trunk flexed;Steppage Gait velocity: decr Gait velocity interpretation: Below normal speed for age/gender General Gait Details: cues for sequence, posture and position from RW.  Increased difficulty advancing R LE 2* partial drop foot   Stairs            Wheelchair Mobility    Modified Rankin (Stroke Patients Only)       Balance                                    Cognition Arousal/Alertness: Awake/alert Behavior During Therapy: WFL for tasks assessed/performed Overall Cognitive Status: Within Functional Limits for tasks assessed                      Exercises Total Joint Exercises Ankle Circles/Pumps: AROM;Both;15 reps;Supine Quad Sets: AROM;Both;10 reps;Supine Heel Slides: AAROM;Right;20 reps;Supine Hip ABduction/ADduction: AAROM;Right;15 reps;Supine    General Comments        Pertinent Vitals/Pain Pain Assessment: 0-10 Pain Score: 4  Pain Location: R hip Pain Descriptors / Indicators: Aching;Sore Pain Intervention(s): Limited activity within patient's tolerance;Monitored during session;Premedicated before session;Ice applied    Home Living                      Prior Function            PT Goals (current goals can now be found in the care plan section) Acute Rehab PT Goals Patient Stated Goal: Regain IND PT Goal Formulation: With patient Time For Goal Achievement: 01/04/16 Potential to Achieve Goals: Good Progress towards PT goals: Progressing toward goals    Frequency    7X/week      PT Plan Current plan remains appropriate    Co-evaluation             End of Session   Activity Tolerance: Patient tolerated treatment well;Patient limited by fatigue Patient left: in chair;with call bell/phone within reach     Time: 0927-0953 PT Time Calculation (min) (ACUTE  ONLY): 26 min  Charges:  $Gait Training: 8-22 mins $Therapeutic Exercise: 8-22 mins                    G Codes:      Jacqueline Ayala 01/01/2016, 1:27 PM

## 2016-01-01 NOTE — Progress Notes (Signed)
Physical Therapy Treatment Patient Details Name: Jacqueline FabianCandace M Ayala MRN: [id number] DOB: 05/12/1965 Today's Date: 01/01/2016    History of Present Illness Pt s/p R THR and with hx of DM, and Charcoat foot    PT Comments    Pt motivated but progressing slowly with ambulation 2* difficulty advancing R LE.  Attempted use of AFL this pm with ltd improvement  Follow Up Recommendations  Home health PT     Equipment Recommendations  None recommended by PT    Recommendations for Other Services OT consult     Precautions / Restrictions Precautions Precautions: Fall Restrictions Weight Bearing Restrictions: No Other Position/Activity Restrictions: WBAT    Mobility  Bed Mobility Overal bed mobility: Needs Assistance Bed Mobility: Supine to Sit     Supine to sit: Min assist;+2 for physical assistance;+2 for safety/equipment;HOB elevated     General bed mobility comments: Pt OOB and requests back to chair  Transfers Overall transfer level: Needs assistance Equipment used: Rolling walker (2 wheeled) Transfers: Sit to/from Stand Sit to Stand: Min guard         General transfer comment: cues for LE management and use of UEs to self assist  Ambulation/Gait Ambulation/Gait assistance: Min assist;+2 safety/equipment Ambulation Distance (Feet): 40 Feet Assistive device: Rolling walker (2 wheeled) Gait Pattern/deviations: Step-to pattern;Decreased step length - right;Decreased step length - left;Shuffle;Trunk flexed Gait velocity: decr Gait velocity interpretation: Below normal speed for age/gender General Gait Details: cues for sequence, posture and position from RW.  Physical assist to advance R LE   Stairs            Wheelchair Mobility    Modified Rankin (Stroke Patients Only)       Balance                                    Cognition Arousal/Alertness: Awake/alert Behavior During Therapy: WFL for tasks assessed/performed Overall  Cognitive Status: Within Functional Limits for tasks assessed                      Exercises Total Joint Exercises Ankle Circles/Pumps: AROM;Both;15 reps;Supine Quad Sets: AROM;Both;10 reps;Supine Heel Slides: AAROM;Right;20 reps;Supine Hip ABduction/ADduction: AAROM;Right;15 reps;Supine    General Comments        Pertinent Vitals/Pain Pain Assessment: 0-10 Pain Score: 4  Pain Location: R hipthigh Pain Descriptors / Indicators: Aching;Sore Pain Intervention(s): Premedicated before session;Monitored during session;Limited activity within patient's tolerance;Ice applied    Home Living                      Prior Function            PT Goals (current goals can now be found in the care plan section) Acute Rehab PT Goals Patient Stated Goal: Regain IND PT Goal Formulation: With patient Time For Goal Achievement: 01/04/16 Potential to Achieve Goals: Good Progress towards PT goals: Progressing toward goals    Frequency    7X/week      PT Plan Current plan remains appropriate    Co-evaluation             End of Session Equipment Utilized During Treatment: Other (comment) (AFO on R) Activity Tolerance: Patient tolerated treatment well;Patient limited by fatigue Patient left: in chair;with call bell/phone within reach     Time: [id number] PT Time Calculation (min) (ACUTE ONLY): 31 min  Charges:  $Gait Training: 23-37 mins $  Therapeutic Exercise: 8-22 mins                    G Codes:      Jacqueline Ayala 01/01/2016, 2:55 PM

## 2016-01-01 NOTE — Progress Notes (Signed)
Foley removed at 0650.

## 2016-01-01 NOTE — Discharge Instructions (Signed)

## 2016-01-01 NOTE — Progress Notes (Signed)
Subjective: 1 Day Post-Op Procedure(s) (LRB): RIGHT TOTAL HIP ARTHROPLASTY ANTERIOR APPROACH (Right) Patient reports pain as moderate.  Did get up with therapy yesterday.  Stable overall.  Objective: Vital signs in last 24 hours: Temp:  [97.8 F (36.6 C)-98.4 F (36.9 C)] 98.4 F (36.9 C) (12/30 0611) Pulse Rate:  [100-120] 106 (12/30 0611) Resp:  [17-27] 18 (12/30 0611) BP: (101-160)/(41-80) 132/70 (12/30 0611) SpO2:  [95 %-100 %] 99 % (12/30 0611) Weight:  [297 lb (134.7 kg)] 297 lb (134.7 kg) (12/29 1145)  Intake/Output from previous day: 12/29 0701 - 12/30 0700 In: 3541.6 [I.V.:3291.6; IV Piggyback:250] Out: 3075 [Urine:2875; Blood:200] Intake/Output this shift: No intake/output data recorded.   Recent Labs  01/01/16 0440  HGB 11.7*    Recent Labs  01/01/16 0440  WBC 11.2*  RBC 4.11  HCT 37.6  PLT PLATELET CLUMPS NOTED ON SMEAR, COUNT APPEARS ADEQUATE    Recent Labs  01/01/16 0440  NA 137  K 3.9  CL 97*  CO2 32  BUN 11  CREATININE 0.61  GLUCOSE 140*  CALCIUM 8.6*   No results for input(s): LABPT, INR in the last 72 hours.  Intact pulses distally Dorsiflexion/Plantar flexion intact Incision: scant drainage  Assessment/Plan: 1 Day Post-Op Procedure(s) (LRB): RIGHT TOTAL HIP ARTHROPLASTY ANTERIOR APPROACH (Right) Up with therapy Plan for discharge tomorrow Discharge home with home health  Kathryne HitchChristopher Y Nollan Muldrow 01/01/2016, 7:39 AM

## 2016-01-01 NOTE — Progress Notes (Signed)
Occupational Therapy Evaluation Patient Details Name: Jacqueline Ayala MRN: [id number] DOB: 05/30/1965 Today's Date: 01/01/2016    History of Present Illness Pt s/p R THR and with hx of DM, and Charcoat foot   Clinical Impression   Patient presents to OT with decreased ADL independence and safety due to the deficits listed below. Will benefit from skilled OT to maximize function and to facilitate a safe discharge. OT will follow.    Follow Up Recommendations  No OT follow up;Supervision/Assistance - 24 hour    Equipment Recommendations  3 in 1 bedside commode    Recommendations for Other Services       Precautions / Restrictions Precautions Precautions: Fall Restrictions Weight Bearing Restrictions: No Other Position/Activity Restrictions: WBAT      Mobility Bed Mobility            General bed mobility comments: pt up in recliner  Transfers             General transfer comment: NT    Balance                                            ADL Overall ADL's : Needs assistance/impaired Eating/Feeding: Independent;Sitting   Grooming: Set up;Sitting               Lower Body Dressing: Total assistance;Sitting/lateral leans Lower Body Dressing Details (indicate cue type and reason): doff shoes/AFO, don socks   Toilet Transfer Details (indicate cue type and reason): [facility]   Toileting - Clothing Manipulation Details (indicate cue type and reason): reports independence with toilet hygiene when toileting with nursing staff       General ADL Comments: Educated pt on role of OT. Will practice simulation of shower transfer and LB self-care next session. Patient has long sponge, long razonr, long lotion applicator. Would like to see reacher. Also discussed toilet hygiene modifications should that become an issue. Patient denied need to practice toilet transfer at this time.      Vision      Perception     Praxis      Pertinent Vitals/Pain Pain Assessment: 0-10 Pain Score: 4  Pain Location: R hipthigh Pain Descriptors / Indicators: Aching;Sore Pain Intervention(s): Limited activity within patient's tolerance;Monitored during session     Hand Dominance     Extremity/Trunk Assessment Upper Extremity Assessment Upper Extremity Assessment: Overall WFL for tasks assessed   Lower Extremity Assessment Lower Extremity Assessment: Defer to PT evaluation       Communication Communication Communication: No difficulties   Cognition Arousal/Alertness: Awake/alert Behavior During Therapy: WFL for tasks assessed/performed Overall Cognitive Status: Within Functional Limits for tasks assessed                     General Comments       Exercises      Shoulder Instructions      Home Living Family/patient expects to be discharged to:: Private residence Living Arrangements: Spouse/significant other Available Help at Discharge: Family Type of Home: House Home Access: Stairs to enter Secretary/administratorntrance Stairs-Number of Steps: 2 Entrance Stairs-Rails: None Home Layout: One level     Bathroom Shower/Tub: Producer, television/film/videoWalk-in shower   Bathroom Toilet: Standard Bathroom Accessibility: Yes How Accessible: Accessible via walker Home Equipment: Walker - 2 wheels   Additional Comments: comfort height toilet; large high bench  in shower      Prior Functioning/Environment Level of Independence: Independent with assistive device(s)                 OT Problem List: Decreased strength;Decreased knowledge of use of DME or AE;Pain   OT Treatment/Interventions: Self-care/ADL training;DME and/or AE instruction;Therapeutic activities;Patient/family education    OT Goals(Current goals can be found in the care plan section) Acute Rehab OT Goals Patient Stated Goal: Regain IND OT Goal Formulation: With patient Time For Goal Achievement: 01/15/16 Potential to Achieve Goals:  Good ADL Goals Pt Will Perform Lower Body Bathing: with min assist;sit to/from stand Pt Will Perform Lower Body Dressing: with min assist;sit to/from stand Pt Will Perform Tub/Shower Transfer: Shower transfer;with min assist;rolling walker;shower seat;ambulating  OT Frequency: Min 2X/week   Barriers to D/C:            Co-evaluation              End of Session    Activity Tolerance: Patient tolerated treatment well Patient left: in chair;with call bell/phone within reach;with family/visitor present   Time: 2952-84131512-1531 OT Time Calculation (min): 19 min Charges:  OT General Charges $OT Visit: 1 Procedure OT Evaluation $OT Eval Low Complexity: 1 Procedure G-Codes:    Roshon Duell A 01/01/2016, 3:43 PM

## 2016-01-02 LAB — GLUCOSE, CAPILLARY: Glucose-Capillary: 110 mg/dL — ABNORMAL HIGH (ref 65–99)

## 2016-01-02 MED ORDER — ONDANSETRON HCL 4 MG PO TABS: 4.0000 mg | ORAL_TABLET | Freq: Four times a day (QID) | ORAL | 0 refills | Status: DC | PRN

## 2016-01-02 MED ORDER — SULFAMETHOXAZOLE-TRIMETHOPRIM 800-160 MG PO TABS: 1.0000 | ORAL_TABLET | Freq: Two times a day (BID) | ORAL | 0 refills | Status: DC

## 2016-01-02 MED ORDER — ASPIRIN 81 MG PO CHEW: 81.0000 mg | CHEWABLE_TABLET | Freq: Two times a day (BID) | ORAL | 0 refills | Status: DC

## 2016-01-02 MED ORDER — OXYCODONE-ACETAMINOPHEN 5-325 MG PO TABS: 1.0000 | ORAL_TABLET | ORAL | 0 refills | Status: DC | PRN

## 2016-01-02 MED ORDER — METHOCARBAMOL 500 MG PO TABS: 500.0000 mg | ORAL_TABLET | Freq: Four times a day (QID) | ORAL | 1 refills | Status: DC | PRN

## 2016-01-02 NOTE — Progress Notes (Signed)
Physical Therapy Treatment Patient Details Name: Jacqueline Ayala MRN: [id number] DOB: 12/08/1965 Today's Date: 01/02/2016    History of Present Illness Pt s/p R THR and with hx of DM, and Charcoat foot    PT Comments    Pt doing much better overall with mobility today; feels ready to D/C home; completed up/down 2 stairs with min/min-guard assist; should continue to progress well with HHPT  Follow Up Recommendations  Home health PT     Equipment Recommendations  None recommended by PT    Recommendations for Other Services       Precautions / Restrictions Precautions Precautions: Fall Restrictions Weight Bearing Restrictions: No Other Position/Activity Restrictions: WBAT    Mobility  Bed Mobility Overal bed mobility: Needs Assistance Bed Mobility: Supine to Sit     Supine to sit: HOB elevated;Supervision;Modified independent (Device/Increase time)     General bed mobility comments: incr time  Transfers Overall transfer level: Needs assistance Equipment used: Rolling walker (2 wheeled) Transfers: Sit to/from Stand Sit to Stand: Min guard;Supervision         General transfer comment: subtle cues for hand placement  Ambulation/Gait Ambulation/Gait assistance: Min guard Ambulation Distance (Feet): 12 Feet Assistive device: Rolling walker (2 wheeled) Gait Pattern/deviations: Step-to pattern;Decreased step length - right;Decreased step length - left;Shuffle;Trunk flexed Gait velocity: decr   General Gait Details: cues for sequence, rest breaks if needed; (amb to stairs)   Stairs Stairs: Yes   Stair Management: No rails;Step to pattern;Backwards;With walker Number of Stairs: 2 General stair comments: cues for sequence and technique  Wheelchair Mobility    Modified Rankin (Stroke Patients Only)       Balance                                    Cognition Arousal/Alertness: Awake/alert Behavior During Therapy: WFL for tasks  assessed/performed Overall Cognitive Status: Within Functional Limits for tasks assessed                      Exercises      General Comments        Pertinent Vitals/Pain Pain Assessment: 0-10 Pain Score: 2  Pain Location: R hip/thigh Pain Descriptors / Indicators: Aching;Sore Pain Intervention(s): Limited activity within patient's tolerance;Monitored during session    Home Living                      Prior Function            PT Goals (current goals can now be found in the care plan section) Acute Rehab PT Goals PT Goal Formulation: With patient Time For Goal Achievement: 01/04/16 Potential to Achieve Goals: Good Progress towards PT goals: Progressing toward goals    Frequency    7X/week      PT Plan Current plan remains appropriate    Co-evaluation   Reason for Co-Treatment: For patient/therapist safety PT goals addressed during session: Mobility/safety with mobility OT goals addressed during session: ADL's and self-care     End of Session Equipment Utilized During Treatment:  (pt wished to defer using  AFOs today) Activity Tolerance: Patient tolerated treatment well Patient left: in chair;with call bell/phone within reach     Time: 1032-1106 PT Time Calculation (min) (ACUTE ONLY): 34 min  Charges:  G CodesDrucilla Chalet:      Jacqueline Ayala 01/02/2016, 11:14 AM

## 2016-01-02 NOTE — Discharge Summary (Signed)
Patient ID: Jacqueline Ayala MRN: [id number] DOB/AGE: July 27, 1965 50 y.o.  Admit date: 12/31/2015 Discharge date: 01/02/2016  Admission Diagnoses:  Principal Problem:   Unilateral primary osteoarthritis, right hip Active Problems:   Status post total replacement of right hip   Discharge Diagnoses:  Same  Past Medical History:  Diagnosis Date  . Arthritis    osteoarthritis -back, right hip, knees.  . Diabetes mellitus   . Diverticulitis   . Gallstones    resolved with surgery  . H/O seasonal allergies   . Headache    not migraine level  . Hyperlipidemia   . Hypertension   . Hypothyroidism   . Neuromuscular disorder (HCC)    charco-marie tooth disease   . PONV (postoperative nausea and vomiting)   . Thyroid disease     Surgeries: Procedure(s): RIGHT TOTAL HIP ARTHROPLASTY ANTERIOR APPROACH on 12/31/2015   Consultants:   Discharged Condition: Improved  Hospital Course: Jacqueline Ayala is an 50 y.o. female who was admitted 12/31/2015 for operative treatment ofUnilateral primary osteoarthritis, right hip. Patient has severe unremitting pain that affects sleep, daily activities, and work/hobbies. After pre-op clearance the patient was taken to the operating room on 12/31/2015 and underwent  Procedure(s): RIGHT TOTAL HIP ARTHROPLASTY ANTERIOR APPROACH.    Patient was given perioperative antibiotics: Anti-infectives    Start     Dose/Rate Route Frequency Ordered Stop   01/02/16 0000  sulfamethoxazole-trimethoprim (BACTRIM DS,SEPTRA DS) 800-160 MG tablet     1 tablet Oral 2 times daily 01/02/16 1150     12/31/15 1400  ceFAZolin (ANCEF) IVPB 2g/100 mL premix     2 g 200 mL/hr over 30 Minutes Intravenous Every 6 hours 12/31/15 1155 12/31/15 2039   12/31/15 0600  ceFAZolin (ANCEF) 3 g in dextrose 5 % 50 mL IVPB     3 g 130 mL/hr over 30 Minutes Intravenous On call to O.R. 12/30/15 1125 12/31/15 0730   12/30/15 1123  ceFAZolin (ANCEF) 3 g in dextrose 5 % 50 mL IVPB   Status:  Discontinued     3 g 130 mL/hr over 30 Minutes Intravenous On call to O.R. 12/30/15 1123 12/30/15 1125       Patient was given sequential compression devices, early ambulation, and chemoprophylaxis to prevent DVT.  Patient benefited maximally from hospital stay and there were no complications.    Recent vital signs: Patient Vitals for the past 24 hrs:  BP Temp Temp src Pulse Resp SpO2  01/02/16 0604 (!) 145/72 98.6 F (37 C) Oral (!) 117 19 98 %  01/01/16 2240 122/87 98.9 F (37.2 C) Oral (!) 113 19 97 %  01/01/16 1427 (!) 108/53 99 F (37.2 C) Oral (!) 116 20 99 %     Recent laboratory studies:  Recent Labs  01/01/16 0440  WBC 11.2*  HGB 11.7*  HCT 37.6  PLT PLATELET CLUMPS NOTED ON SMEAR, COUNT APPEARS ADEQUATE  NA 137  K 3.9  CL 97*  CO2 32  BUN 11  CREATININE 0.61  GLUCOSE 140*  CALCIUM 8.6*     Discharge Medications:   Allergies as of 01/02/2016      Reactions   Codeine Nausea And Vomiting   Ibuprofen    Upset stomach with long term use      Medication List    STOP taking these medications   aspirin EC 81 MG tablet Replaced by:  aspirin 81 MG chewable tablet   aspirin-acetaminophen-caffeine 250-250-65 MG tablet Commonly known as:  EXCEDRIN MIGRAINE  ciprofloxacin 500 MG tablet Commonly known as:  CIPRO   meloxicam 15 MG tablet Commonly known as:  MOBIC   methylPREDNISolone 4 MG tablet Commonly known as:  MEDROL   traMADol 50 MG tablet Commonly known as:  ULTRAM     TAKE these medications   aspirin 81 MG chewable tablet Chew 1 tablet (81 mg total) by mouth 2 (two) times daily. Replaces:  aspirin EC 81 MG tablet   diphenhydrAMINE 25 MG tablet Commonly known as:  BENADRYL Take 50 mg by mouth at bedtime as needed for sleep.   levothyroxine 125 MCG tablet Commonly known as:  SYNTHROID, LEVOTHROID Take 250 mcg by mouth daily before breakfast. Brand name Synthroid   loratadine 10 MG tablet Commonly known as:  CLARITIN Take 10  mg by mouth daily as needed. Seasonal allergies   metFORMIN 500 MG 24 hr tablet Commonly known as:  GLUCOPHAGE-XR Take 1,000 mg by mouth 2 (two) times daily.   methocarbamol 500 MG tablet Commonly known as:  ROBAXIN Take 1 tablet (500 mg total) by mouth every 6 (six) hours as needed for muscle spasms. What changed:  when to take this  reasons to take this   ondansetron 4 MG tablet Commonly known as:  ZOFRAN Take 1 tablet (4 mg total) by mouth every 6 (six) hours as needed for nausea.   oxyCODONE-acetaminophen 5-325 MG tablet Commonly known as:  ROXICET Take 1-2 tablets by mouth every 4 (four) hours as needed.   ranitidine 150 MG tablet Commonly known as:  ZANTAC Take 150 mg by mouth 2 (two) times daily.   sulfamethoxazole-trimethoprim 800-160 MG tablet Commonly known as:  BACTRIM DS,SEPTRA DS Take 1 tablet by mouth 2 (two) times daily.   valsartan-hydrochlorothiazide 160-25 MG tablet Commonly known as:  DIOVAN-HCT Take 1 tablet by mouth every morning            Durable Medical Equipment        Start     Ordered   01/01/16 1542  DME 3 n 1  Once    Comments:  Bariatric   01/01/16 1542   12/31/15 1156  DME Walker rolling  Once    Question:  Patient needs a walker to treat with the following condition  Answer:  Status post total replacement of right hip   12/31/15 1155      Diagnostic Studies: Dg Pelvis Portable  Result Date: 12/31/2015 CLINICAL DATA:  Total right hip arthroplasty EXAM: DG C-ARM 61-120 MIN-NO REPORT; PORTABLE PELVIS 1-2 VIEWS COMPARISON:  Intraoperative films 12/31/2015 FINDINGS: Well-positioned right hip prosthesis without complicating features. The distal tip is not visualized. Moderate degenerative changes involving the left hip. The pubic symphysis and bony pelvis are intact. IMPRESSION: Right total hip arthroplasty components in good position without complicating features. Electronically Signed   By: Rudie MeyerP.  Gallerani M.D.   On: 12/31/2015 10:04    Dg C-arm 61-120 Min-no Report  Result Date: 12/31/2015 There is no Radiologist interpretation  for this exam.  Dg Hip Operative Unilat W Or W/o Pelvis Right  Result Date: 12/31/2015 CLINICAL DATA:  Patient status post right hip arthroplasty. EXAM: OPERATIVE RIGHT HIP (WITH PELVIS IF PERFORMED) 2 VIEWS TECHNIQUE: Fluoroscopic spot image(s) were submitted for interpretation post-operatively. COMPARISON:  Lumbar spine radiograph 12/08/2015; hip radiograph 09/12/2012. FINDINGS: Two intraoperative fluoroscopic images were submitted demonstrating right hip arthroplasty. Hardware appears in appropriate position on fluoroscopic images. No definite acute process. IMPRESSION: Patient status post right hip arthroplasty. Electronically Signed   By: Kenard Gowerrew  Earlene Plateravis M.D.   On: 12/31/2015 09:06   Xr Lumbar Spine 2-3 Views  Result Date: 12/08/2015 Lumbar spine 2 views: No acute fracture. Grade 1 spondylolisthesis at L4-5. Loss of normal lordotic curvature. Endplate spurring of the lower thoracic and upper lumbar spine . No other bony abnormalities.   Disposition: 01-Home or Self Care    Follow-up Information    Kathryne Hitchhristopher Y Blackman, MD Follow up in 2 week(s).   Specialty:  Orthopedic Surgery Contact information: [address] [place] [address] [phone]        [facility] Follow up.   Specialty:  Home Health Services Why:  Home Health Physical Therapy Contact information: [address] [place] [address] [phone]            Signed: Richardean CanalGILBERT CLARK 01/02/2016, 11:53 AM

## 2016-01-02 NOTE — Progress Notes (Signed)
Subjective: 2 Days Post-Op Procedure(s) (LRB): RIGHT TOTAL HIP ARTHROPLASTY ANTERIOR APPROACH (Right) Patient reports pain as mild.  No complaints wants to go home today  Objective: Vital signs in last 24 hours: Temp:  [98.6 F (37 C)-99 F (37.2 C)] 98.6 F (37 C) (12/31 0604) Pulse Rate:  [113-117] 117 (12/31 0604) Resp:  [19-20] 19 (12/31 0604) BP: (108-145)/(53-87) 145/72 (12/31 0604) SpO2:  [97 %-99 %] 98 % (12/31 0604)  Intake/Output from previous day: 12/30 0701 - 12/31 0700 In: 1686.3 [P.O.:1070; I.V.:616.3] Out: 920 [Urine:920] Intake/Output this shift: Total I/O In: 240 [P.O.:240] Out: -    Recent Labs  01/01/16 0440  HGB 11.7*    Recent Labs  01/01/16 0440  WBC 11.2*  RBC 4.11  HCT 37.6  PLT PLATELET CLUMPS NOTED ON SMEAR, COUNT APPEARS ADEQUATE    Recent Labs  01/01/16 0440  NA 137  K 3.9  CL 97*  CO2 32  BUN 11  CREATININE 0.61  GLUCOSE 140*  CALCIUM 8.6*   No results for input(s): LABPT, INR in the last 72 hours.   Right lower extremity: Sensation intact distally Intact pulses distally Dorsiflexion/Plantar flexion intact Incision: scant drainage Compartment soft  Assessment/Plan: 2 Days Post-Op Procedure(s) (LRB): RIGHT TOTAL HIP ARTHROPLASTY ANTERIOR APPROACH (Right) Discharge home with home health today.  Kenleigh Toback 01/02/2016, 11:53 AM

## 2016-01-02 NOTE — Progress Notes (Signed)
Discharged from floor via w/c for transport home by car. Belongings & family with pt. No changes in assessment. Purva Vessell  

## 2016-01-02 NOTE — Progress Notes (Signed)
Occupational Therapy Treatment Patient Details Name: ROIZA WIEDEL MRN: [id number] DOB: 04/04/1965 Today's Date: 01/02/2016    History of present illness Pt s/p R THR and with hx of DM, and Charcoat foot   OT comments  All OT education completed and pt questions answered. No further OT needs. Will sign off.  Follow Up Recommendations  No OT follow up;Supervision/Assistance - 24 hour    Equipment Recommendations  3 in 1 bedside commode    Recommendations for Other Services      Precautions / Restrictions Precautions Precautions: Fall Restrictions Weight Bearing Restrictions: No Other Position/Activity Restrictions: WBAT       Mobility Bed Mobility                  Transfers                      Balance                                   ADL Overall ADL's : Needs assistance/impaired Eating/Feeding: Independent;Sitting               Upper Body Dressing : Set up;Sitting   Lower Body Dressing: Moderate assistance;Sit to/from stand   Toilet Transfer: Min guard;Stand-pivot;BSC;RW   Toileting- Water quality scientist and Hygiene: Min guard;Sit to/from stand   Tub/ Banker: Copy Details (indicate cue type and reason): simulation on stairs backing in and coming out forward with RW, min A required Functional mobility during ADLs: Minimal assistance;Min guard;Rolling walker        Vision                     Perception     Praxis      Cognition   Behavior During Therapy: WFL for tasks assessed/performed Overall Cognitive Status: Within Functional Limits for tasks assessed                       Extremity/Trunk Assessment               Exercises     Shoulder Instructions       General Comments      Pertinent Vitals/ Pain       Pain Assessment: 0-10 Pain Score: 2  Pain Location: R hip/thigh Pain Descriptors / Indicators: Aching;Sore Pain  Intervention(s): Limited activity within patient's tolerance;Monitored during session;Premedicated before session;Repositioned;Ice applied  Home Living                                          Prior Functioning/Environment              Frequency           Progress Toward Goals  OT Goals(current goals can now be found in the care plan section)  Progress towards OT goals: Goals met/education completed, patient discharged from [facility] All goals met and education completed, patient discharged from OT services    Co-evaluation    PT/OT/SLP Co-Evaluation/Treatment: Yes Reason for Co-Treatment: For patient/therapist safety PT goals addressed during session: Mobility/safety with mobility OT goals addressed during session: ADL's and self-care      End of Session Equipment Utilized During Treatment: Rolling walker   Activity Tolerance Patient tolerated treatment  well   Patient Left in chair;with call bell/phone within reach   Nurse Communication Mobility status        Time: 2178-3754 OT Time Calculation (min): 35 min  Charges: OT General Charges $OT Visit: 1 Procedure OT Treatments $Self Care/Home Management : 8-22 mins  Zelma Snead A 01/02/2016, 11:08 AM

## 2016-01-02 NOTE — Care Management Note (Signed)
Case Management Note  Patient Details  Name: Jacqueline FabianCandace M O'Briant MRN: [id number] Date of Birth: 03/10/1965  Subjective/Objective:  Right THA                  Action/Plan: Discharge Planning: AVS reviewed 01/01/2016 NCM spoke to pt and offered choice. Preoperatively arranged with [facility]. Pt agreeable to [facility]. Has RW at home. Requesting wide 3n1. Contacted [facility] DME rep.    Expected Discharge Date:  01/02/2016              Expected Discharge Plan:  Home w Home Health Services  In-House Referral:  NA  Discharge planning Services  CM Consult  Post Acute Care Choice:  Home Health Choice offered to:  Patient  DME Arranged:  3-N-1 DME Agency:  [facility]  HH Arranged:  PT HH Agency:  [facility]  Status of Service:  Completed, signed off  If discussed at [employer] Length of Stay Meetings, dates discussed:    Additional Comments:  Elliot CousinShavis, Dushaun Okey Ellen, RN 01/02/2016, 5:56 PM

## 2016-01-04 NOTE — Op Note (Signed)
NAMTawny Asal:  Ayala, Jacqueline            ACCOUNT NO.:  [id number]  MEDICAL RECORD NO.:  [id number]  LOCATION:                                 FACILITY:  PHYSICIAN:  Vanita PandaChristopher Y. Magnus IvanBlackman, M.D.DATE OF BIRTH:  Aug 07, 1965  DATE OF PROCEDURE:  12/31/2015 DATE OF DISCHARGE:  01/02/2016                              OPERATIVE REPORT   PREOPERATIVE DIAGNOSIS:  Primary osteoarthritis and degenerative joint disease, right hip.  POSTOPERATIVE DIAGNOSIS:  Primary osteoarthritis and degenerative joint disease, right hip.  PROCEDURE:  Right total hip arthroplasty through direct anterior approach.  IMPLANTS:  DePuy Sector Gription acetabular component size 50, size 32+ 0 neutral polyethylene liner, size 10 Corail femoral component with varus offset, size 32+ 1 ceramic hip ball.  SURGEON:  Vanita PandaChristopher Y. Magnus IvanBlackman, M.D.  ASSISTANT:  Richardean CanalGilbert Clark, PA-C.  ANESTHESIA:  Spinal.  BLOOD LOSS:  200 mL.  ANTIBIOTICS:  3 g of IV Ancef.  COMPLICATIONS:  None.  INDICATIONS:  [name] is a 51 year old female, well known to me. She has had debilitating right hip pain for now over 5 years.  She has tried and failed activity modification, anti-inflammatories, steroid injection, and even weight loss.  Her x-rays show complete loss of the joint space.  She is a morbidly obese individual and I have talked to her about the hidden risk off acute blood loss anemia, nerve and vessel injury, fracture, infection, dislocation, DVT given her obesity.  I did examine her prior to surgery and felt that her leg was actually very small and once we just mobilized her abdomen out of the field, we will be able to get an approach to the hip pretty easily and I just took the exam in order to know this.  She understands our goals are decreased pain, improved mobility, and overall improved quality of life.  PROCEDURE DESCRIPTION:  After informed consent was obtained, appropriate right hip was marked.  She was brought  to the operating room, where spinal anesthesia was obtained while she was on her stretcher.  She was then laid in a supine position on the stretcher and Foley catheter was placed and both feet had traction boots applied to them.  Next, she was placed supine on the Hana fracture table with the perineal post in place and both legs in inline skeletal traction devices, but no traction applied.  We mobilized her abdomen and taped this out of the field.  We then prepped her right hip with DuraPrep and sterile drapes.  A time-out was called and she was identified as correct patient and correct right hip.  We then made an incision just inferior and posterior to the anterior superior iliac spine and carried this easily obliquely down the leg.  We dissected down the tensor fascia lata muscle and the tensor fascia was then divided longitudinally to proceed with direct anterior approach to the hip.  We identified and cauterized the circumflex vessels, then identified the hip capsule, opened up the hip capsule in an L-type format finding significant periarticular osteophytes.  We placed Cobra retractors around the medial and lateral femoral neck and then made our femoral neck cut with an oscillating saw proximal to the lesser trochanter and completed  this with an osteotome.  We placed a corkscrew guide in the femoral head and removed the femoral head in its entirety, and found it to be completely devoid of cartilage.  We then cleaned the acetabular remnants of acetabular labrum.  I placed a bent Hohmann over the medial acetabular rim.  We then began reaming under direct visualization from a size 42 reamer in stepwise increments up to a size 50 with all reamers under direct visualization and the last reamer also under direct fluoroscopy, so we could obtain our depth of reaming, our inclination, and anteversion.  Once we were pleased with this, we placed the real DePuy Sector Gription acetabular  component size 50 and a 32+ 0 neutral polyethylene liner for that size acetabular component.  Attention was then turned to the femur.  With the leg externally rotated to 120 degrees, extended and adducted, we were able to place a Mueller retractor medially and a Hohmann retractor behind the greater trochanter.  We released the lateral joint capsule and used a box cutting osteotome to enter the femoral canal and a rongeur to lateralize.  We then began broaching from a size 8 broach using the Corail broaching system up to a size 10.  With the size 10 in place, we trialed a varus offset femoral neck and a 32+ 1 hip ball, we reduced this in the acetabulum and we were pleased most importantly with the leg length, offset, and stability.  We did feel like she is slightly long on this side; however, she is significantly obese and there is tendency for obesity people to lever drop with their hip.  She also has disease on her other side that may need replacing at some point.  Once I was placed with the trial assessment, we dislocated the hip and removed the trial components.  I placed the real Corail femoral component with varus offset size 10 and the real 32+ 1 ceramic hip ball, we reduced this in the acetabulum and again it was stable.  We irrigated the soft tissue with normal saline solution using pulsatile lavage.  We closed the joint capsule with interrupted #1 Ethibond suture followed by #1 Vicryl running in the tensor fascia, 0 Vicryl in the deep tissue, 2-0 Vicryl in the subcutaneous tissue, and staples on the skin.  Xeroform and Aquacel dressings applied.  She was taken off the table and taken to the recovery room in stable condition.  All final counts were correct. There were no complications noted.  Of note, Richardean Canal, PA-C, assisted during the entire case.  His assistance was crucial for facilitating the major aspects of this case.     Vanita Panda. Magnus Ivan,  M.D.   ______________________________ Vanita Panda. Magnus Ivan, M.D.    CYB/MEDQ  D:  12/31/2015  T:  01/01/2016  Job:  [id number]

## 2016-01-13 ENCOUNTER — Ambulatory Visit (INDEPENDENT_AMBULATORY_CARE_PROVIDER_SITE_OTHER): Payer: Medicare Other | Admitting: Orthopaedic Surgery

## 2016-01-13 DIAGNOSIS — Z96641 Presence of right artificial hip joint: Secondary | ICD-10-CM

## 2016-01-13 MED ORDER — OXYCODONE-ACETAMINOPHEN 5-325 MG PO TABS: 1.0000 | ORAL_TABLET | Freq: Four times a day (QID) | ORAL | 0 refills | Status: DC | PRN

## 2016-01-13 NOTE — Progress Notes (Signed)
The patient is a 51 year old who is 2 weeks status post a right total hip arthroplasty through direct anterior approach. She is very pleasant individual who does have some significant obesity. She is doing well. She is ambulating between a walker and a cane. She has no issues she states. She would like a refill for pain medication. She'll a lot of questions we're able to answer in the office today as well.  On examination there is no significant seroma over right hip incision. His evidence infection. I removed all staples and placed knee Steri-Strips and a dressing. She can get this wet in the shower tomorrow. I did give her a refill of pain medication. We will see her back in a month to see how she is doing overall but no x-rays needed.

## 2016-02-03 ENCOUNTER — Other Ambulatory Visit (INDEPENDENT_AMBULATORY_CARE_PROVIDER_SITE_OTHER): Payer: Self-pay | Admitting: Physician Assistant

## 2016-02-03 NOTE — Telephone Encounter (Signed)
Please advise 

## 2016-02-10 ENCOUNTER — Ambulatory Visit (INDEPENDENT_AMBULATORY_CARE_PROVIDER_SITE_OTHER): Payer: Medicare Other | Admitting: Orthopaedic Surgery

## 2016-02-10 ENCOUNTER — Encounter (INDEPENDENT_AMBULATORY_CARE_PROVIDER_SITE_OTHER): Payer: Self-pay | Admitting: Physician Assistant

## 2016-02-10 ENCOUNTER — Ambulatory Visit (INDEPENDENT_AMBULATORY_CARE_PROVIDER_SITE_OTHER): Payer: Medicare Other | Admitting: Physician Assistant

## 2016-02-10 DIAGNOSIS — Z96641 Presence of right artificial hip joint: Secondary | ICD-10-CM

## 2016-02-10 DIAGNOSIS — M542 Cervicalgia: Secondary | ICD-10-CM

## 2016-02-10 MED ORDER — CYCLOBENZAPRINE HCL 10 MG PO TABS: 10.0000 mg | ORAL_TABLET | Freq: Three times a day (TID) | ORAL | 0 refills | Status: DC | PRN

## 2016-02-10 NOTE — Progress Notes (Signed)
Office Visit Note   Patient: Jacqueline Ayala           Date of Birth: 10-12-1965           MRN: [id number] Visit Date: 02/10/2016              Requested by: Benita Stabile, MD [address] [place], [place] [address] PCP: Dwana Melena, MD   Assessment & Plan: Visit Diagnoses:  1. Status post total replacement of right hip   2. Cervicalgia     Plan:Neck exercise handout given . Moist heat to the neck. Discontinue her Robaxin place her on Flexeril, resume her Mobic. Regards to the hip she'll keep the proximal incision clean and dry. Follow-up in one Month.No Radiographs at that time unless clinically indicated. Staple removed right hip.  Follow-Up Instructions: No Follow-up on file.   Orders:  No orders of the defined types were placed in this encounter.  Meds ordered this encounter  Medications  . cyclobenzaprine (FLEXERIL) 10 MG tablet    Sig: Take 1 tablet (10 mg total) by mouth 3 (three) times daily as needed for muscle spasms.    Dispense:  40 tablet    Refill:  0      Procedures: No procedures performed   Clinical Data: No additional findings.   Subjective: Chief Complaint  Patient presents with  . Right Hip - Routine Post Op  . Follow-up    HPI Jacqueline Ayala returns today follow-up of her right total hip which was performed 41 days ago. Hips overall doing well she does states that this is a retained staple. She's having some neck pain with numbness down her left arm which has started this past Sunday. Review of Systems   Objective: Vital Signs: There were no vitals taken for this visit.  Physical Exam Alert and oriented 3 no acute distress Ortho Exam Cervical Spine excellent range of motion the spine without pain negative Spurling's test. Tenderness along the medial border left scapula. Upper extremity strength testing 5 out of 5 throughout against resistance. Sensation intact throughout the hands and full motor of both hands. Radial pulses are 2+ and  equal and symmetric. Hip good range of motion. Surgical incisions healing well. There is a retained sutures.staple at the very proximal portion of the incision no signs of gross infection there is some staple irritation however.  Specialty Comments:  No specialty comments available.  Imaging: No results found.   PMFS History: Patient Active Problem List   Diagnosis Date Noted  . Unilateral primary osteoarthritis, right hip 12/31/2015  . Status post total replacement of right hip 12/31/2015  . S/P laparoscopic cholecystectomy 06/30/2011  . Gallstones 06/15/2011  . Hypothyroidism 06/08/2011  . Hyperlipemia 06/08/2011  . Arthritis 06/08/2011  . 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 Seasonal allergies 06/08/2011  . Diverticulitis 06/08/2011  . HIGH BLOOD PRESSURE 07/25/2006   Past Medical History:  Diagnosis Date  . Arthritis    osteoarthritis -back, right hip, knees.  . Diabetes mellitus   . Diverticulitis   . Gallstones    resolved with surgery  . H/O seasonal allergies   . Headache    not migraine level  . Hyperlipidemia   . Hypertension   . Hypothyroidism   . Neuromuscular disorder (HCC)    charco-marie tooth disease   . PONV (postoperative nausea and vomiting)   . Thyroid disease     Family History  Problem Relation Age of Onset  . Heart disease Father   .  Other Mother     Past Surgical History:  Procedure Laterality Date  . CHOLECYSTECTOMY  06/21/2011   Procedure: LAPAROSCOPIC CHOLECYSTECTOMY WITH INTRAOPERATIVE CHOLANGIOGRAM;  Surgeon: Valarie MerinoMatthew B Martin, MD;  Location: [facility];  Service: General;  Laterality: N/A;  . NOSE SURGERY     removal of bone spur  . TOTAL HIP ARTHROPLASTY Right 12/31/2015   Procedure: RIGHT TOTAL HIP ARTHROPLASTY ANTERIOR APPROACH;  Surgeon: Kathryne Hitchhristopher Y Blackman, MD;  Location: [facility];  Service: Orthopedics;  Laterality: Right;  . WISDOM TOOTH EXTRACTION     Social History   Occupational History  . Not on file.    Social History Main Topics  . Smoking status: Never Smoker  . Smokeless tobacco: Never Used  . Alcohol use No  . Drug use: No  . Sexual activity: Yes    Birth control/ protection: None

## 2016-02-17 ENCOUNTER — Telehealth (INDEPENDENT_AMBULATORY_CARE_PROVIDER_SITE_OTHER): Payer: Self-pay | Admitting: Physician Assistant

## 2016-02-17 MED ORDER — METHYLPREDNISOLONE 4 MG PO TABS: ORAL_TABLET | ORAL | 0 refills | Status: DC

## 2016-02-17 NOTE — Telephone Encounter (Signed)
Please advise 

## 2016-02-17 NOTE — Telephone Encounter (Signed)
Patient called advised the muscle relaxer has not helped and want to know if Jacqueline Ayala can do a course of cortisone. The number to contact her is [phone]

## 2016-02-17 NOTE — Addendum Note (Signed)
Addended by: Albertina ParrGARCIA, Kavian Peters on: 02/17/2016 04:58 PM   Modules accepted: Orders

## 2016-02-17 NOTE — Telephone Encounter (Signed)
Medrol dose pack 4mg   Take as directed #21 zero refills

## 2016-02-17 NOTE — Telephone Encounter (Signed)
Faxed into pharm. Called pt no answer LMOM

## 2016-03-06 ENCOUNTER — Ambulatory Visit (INDEPENDENT_AMBULATORY_CARE_PROVIDER_SITE_OTHER): Payer: Medicare Other | Admitting: Physician Assistant

## 2016-03-13 ENCOUNTER — Ambulatory Visit (INDEPENDENT_AMBULATORY_CARE_PROVIDER_SITE_OTHER): Payer: Medicare Other | Admitting: Physician Assistant

## 2016-11-07 ENCOUNTER — Other Ambulatory Visit (HOSPITAL_COMMUNITY): Payer: Self-pay | Admitting: Internal Medicine

## 2016-11-07 ENCOUNTER — Ambulatory Visit (HOSPITAL_COMMUNITY)
Admission: RE | Admit: 2016-11-07 | Discharge: 2016-11-07 | Disposition: A | Discharge status: Home / Self Care | Payer: Medicare Other | Source: Ambulatory Visit | Attending: Internal Medicine | Admitting: Internal Medicine

## 2016-11-07 ENCOUNTER — Other Ambulatory Visit: Payer: Self-pay | Admitting: Internal Medicine

## 2016-11-07 DIAGNOSIS — R0902 Hypoxemia: Secondary | ICD-10-CM

## 2016-11-07 DIAGNOSIS — R0602 Shortness of breath: Secondary | ICD-10-CM

## 2016-11-07 DIAGNOSIS — R918 Other nonspecific abnormal finding of lung field: Secondary | ICD-10-CM | POA: Diagnosis not present

## 2016-11-16 ENCOUNTER — Encounter (HOSPITAL_BASED_OUTPATIENT_CLINIC_OR_DEPARTMENT_OTHER): Payer: Self-pay

## 2016-11-16 DIAGNOSIS — G4710 Hypersomnia, unspecified: Secondary | ICD-10-CM

## 2016-12-28 ENCOUNTER — Encounter (HOSPITAL_BASED_OUTPATIENT_CLINIC_OR_DEPARTMENT_OTHER): Payer: Medicare Other

## 2017-01-18 ENCOUNTER — Other Ambulatory Visit: Payer: Self-pay

## 2017-01-18 DIAGNOSIS — R609 Edema, unspecified: Secondary | ICD-10-CM

## 2017-01-18 DIAGNOSIS — L97509 Non-pressure chronic ulcer of other part of unspecified foot with unspecified severity: Secondary | ICD-10-CM

## 2017-01-20 ENCOUNTER — Ambulatory Visit (HOSPITAL_BASED_OUTPATIENT_CLINIC_OR_DEPARTMENT_OTHER): Payer: Medicare Other | Attending: Internal Medicine | Admitting: Internal Medicine

## 2017-01-20 VITALS — Ht 67.0 in | Wt 310.0 lb

## 2017-01-20 DIAGNOSIS — R0902 Hypoxemia: Secondary | ICD-10-CM | POA: Diagnosis present

## 2017-01-20 DIAGNOSIS — G4710 Hypersomnia, unspecified: Secondary | ICD-10-CM | POA: Diagnosis not present

## 2017-01-20 DIAGNOSIS — G4736 Sleep related hypoventilation in conditions classified elsewhere: Secondary | ICD-10-CM | POA: Diagnosis not present

## 2017-01-20 DIAGNOSIS — G4730 Sleep apnea, unspecified: Secondary | ICD-10-CM

## 2017-01-27 DIAGNOSIS — G4730 Sleep apnea, unspecified: Secondary | ICD-10-CM

## 2017-01-27 DIAGNOSIS — G4710 Hypersomnia, unspecified: Secondary | ICD-10-CM | POA: Diagnosis not present

## 2017-01-27 NOTE — Procedures (Signed)
Patient Name: Jacqueline Ayala, Jacqueline Ayala Study Date: 01/20/2017 Gender: Female D.O.B: 17-Jan-1965 Age (years): 51 Referring Provider: Catalina PizzaZach Hall Height (inches): 67 Interpreting Physician: Jetty Duhamellinton Lynnmarie Lovett MD, ABSM Weight (lbs): 310 RPSGT: Genia HotterWaller-Lomax, Karole BMI: 49 MRN: [id number] Neck Size: 15.00 <br> <br> CLINICAL INFORMATION Sleep Study Type: NPSG  Indication for sleep study: Excessive Daytime Sleepiness, Hypertension, Obesity  Epworth Sleepiness Score: 10  SLEEP STUDY TECHNIQUE As per the AASM Manual for the Scoring of Sleep and Associated Events v2.3 (April 2016) with a hypopnea requiring 4% desaturations.  The channels recorded and monitored were frontal, central and occipital EEG, electrooculogram (EOG), submentalis EMG (chin), nasal and oral airflow, thoracic and abdominal wall motion, anterior tibialis EMG, snore microphone, electrocardiogram, and pulse oximetry.  MEDICATIONS Medications self-administered by patient taken the night of the study : none reported  SLEEP ARCHITECTURE The study was initiated at 10:12:55 PM and ended at 5:07:00 AM.  Sleep onset time was 18.4 minutes and the sleep efficiency was 33.3%. The total sleep time was 138.0 minutes.  Stage REM latency was 344.5 minutes.  The patient spent 51.09% of the night in stage N1 sleep, 39.86% in stage N2 sleep, 2.17% in stage N3 and 6.88% in REM.  Alpha intrusion was absent.  Supine sleep was 0.00%.  RESPIRATORY PARAMETERS The overall apnea/hypopnea index (AHI) was 3.9 per hour. There were 0 total apneas, including 0 obstructive, 0 central and 0 mixed apneas. There were 9 hypopneas and 60 RERAs.  The AHI during Stage REM sleep was 0.0 per hour.  AHI while supine was N/A per hour.  The mean oxygen saturation was 88.64%. The minimum SpO2 during sleep was 76.00%.  moderate snoring was noted during this study.  CARDIAC DATA The 2 lead EKG demonstrated sinus rhythm. The mean heart rate was 99.90 beats per  minute. Other EKG findings include: PVCs.  LEG MOVEMENT DATA The total PLMS were 27 with a resulting PLMS index of 11.74. Associated arousal with leg movement index was 1.3 .  IMPRESSIONS - Occasional obstructive sleep apneas occurred during this study, within normal limits (AHI = 3.9/h). - No significant central sleep apnea occurred during this study (CAI = 0.0/h). - Severe oxygen desaturation was noted during this study (Min O2 = 76.00%, Mean 88.6%). Supplemental oxygen 1L/min added at 01:48 and increased to 2L/min at 2:55 AM for sustained hypoxemia. Saturation maintained around 91% on 2L/min. - The patient snored with moderate snoring volume. - EKG findings include PVCs. - Mild periodic limb movements of sleep occurred during the study. No significant associated arousals. - Patient slept in recliner chair. Significant difficulty initiating and maintaining sleep. Total sleep time 138 minutes.  DIAGNOSIS - Nocturnal Hypoxemia (327.26 [G47.36 ICD-10])  RECOMMENDATIONS - Consider assessment for obesity-hypoventilation syndrome or cardiopulmonary disease to explain hypoxemia. Patient may qualify for BIPAP ST or ASV device to improve ventilation during sleep, if supplemental O2 causes symptomatic CO2 retention.  - Be carefulwith alcohol, sedatives and other CNS depressants that may worsen sleep apnea and disrupt normal sleep architecture. - Sleep hygiene should be reviewed to assess factors that may improve sleep quality. - Weight management and regular exercise should be initiated or continued if appropriate.  [Electronically signed] 01/27/2017 11:44 AM  Jetty Duhamellinton Lin Glazier MD, ABSM Diplomate, American Board of Sleep Medicine   NPI: [id number]                          Jetty Duhamellinton Yevette Knust Diplomate, American Board of Sleep Medicine  ELECTRONICALLY SIGNED  ON:  01/27/2017, 11:34 AM [facility] PH: [phone]   FX: [phone] ACCREDITED BY THE  AMERICAN ACADEMY OF SLEEP MEDICINE

## 2017-02-12 ENCOUNTER — Encounter (INDEPENDENT_AMBULATORY_CARE_PROVIDER_SITE_OTHER): Payer: Self-pay | Admitting: Orthopaedic Surgery

## 2017-02-12 ENCOUNTER — Ambulatory Visit (INDEPENDENT_AMBULATORY_CARE_PROVIDER_SITE_OTHER): Payer: Medicare Other | Admitting: Orthopaedic Surgery

## 2017-02-12 ENCOUNTER — Ambulatory Visit (INDEPENDENT_AMBULATORY_CARE_PROVIDER_SITE_OTHER): Payer: Medicare Other

## 2017-02-12 DIAGNOSIS — M25552 Pain in left hip: Secondary | ICD-10-CM | POA: Diagnosis not present

## 2017-02-12 MED ORDER — METHYLPREDNISOLONE ACETATE 40 MG/ML IJ SUSP
40.0000 mg | INTRAMUSCULAR | Status: AC | PRN
  Administered 2017-02-12: 40 mg via INTRA_ARTICULAR

## 2017-02-12 MED ORDER — MELOXICAM 7.5 MG PO TABS: 7.5000 mg | ORAL_TABLET | Freq: Every day | ORAL | 3 refills | Status: DC

## 2017-02-12 MED ORDER — TRAMADOL HCL 50 MG PO TABS: 100.0000 mg | ORAL_TABLET | Freq: Three times a day (TID) | ORAL | 0 refills | Status: DC | PRN

## 2017-02-12 MED ORDER — LIDOCAINE HCL 1 % IJ SOLN
3.0000 mL | INTRAMUSCULAR | Status: AC | PRN
  Administered 2017-02-12: 3 mL

## 2017-02-12 NOTE — Progress Notes (Signed)
Office Visit Note   Patient: Jacqueline Ayala           Date of Birth: [birth year]           MRN: [id number] Visit Date: 02/12/2017              Requested by: Benita Stabile, MD [address] [place], [place] [address] PCP: Benita Stabile, MD   Assessment & Plan: Visit Diagnoses:  1. Pain in left hip     Plan: I do feel would be appropriate to have Dr. Alvester Morin try an intra-articular injection in her left hip under direct fluoroscopy.  She may eventually need something with her facet joints and lumbar spine but I prefer weight loss as well as physical therapy for those areas.  I will see her back myself in 4 weeks see how she is doing overall.  I am going to send in tramadol and meloxicam for her per her request as well.  I did counsel her again about weight loss.  Follow-Up Instructions: Return in about 4 weeks (around 03/12/2017).   Orders:  Orders Placed This Encounter  Procedures  . XR HIP UNILAT W OR W/O PELVIS 1V LEFT   Meds ordered this encounter  Medications  . meloxicam (MOBIC) 7.5 MG tablet    Sig: Take 1 tablet (7.5 mg total) by mouth daily.    Dispense:  60 tablet    Refill:  3  . traMADol (ULTRAM) 50 MG tablet    Sig: Take 2 tablets (100 mg total) by mouth 3 (three) times daily as needed.    Dispense:  60 tablet    Refill:  0      Procedures: Large Joint Inj: L greater trochanter on 02/12/2017 11:09 AM Indications: pain and diagnostic evaluation Details: 22 G 1.5 in needle, lateral approach  Arthrogram: No  Medications: 3 mL lidocaine 1 %; 40 mg methylPREDNISolone acetate 40 MG/ML Outcome: tolerated well, no immediate complications Procedure, treatment alternatives, risks and benefits explained, specific risks discussed. Consent was given by the patient. Immediately prior to procedure a time out was called to verify the correct patient, procedure, equipment, support staff and site/side marked as required. Patient was prepped and draped in the usual sterile  fashion.       Clinical Data: No additional findings.   Subjective: Chief Complaint  Patient presents with  . Left Hip - Pain   The patient is someon I have seen before.e she is morbidly obese individual who is a diabetic but a hemoglobin A1c of the low 6 range she is doing quite well.  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  She weighs about 300 pounds.  Most of this was in her abdomen which we could easily mobilize and her leg was very thin.  She is been developing low back pain and left-sided hip pain now is the right hip does well.  Again she says her hemoglobin A1c is in the low 6 range.  She would like to try some medications if this would help.  She is also working on activity modification and weight loss. HPI  Review of Systems She currently denies any headache, chest pain, shortness of breath, fever, chills, nausea, vomiting.  Objective: Vital Signs: There were no vitals taken for this visit.  Physical Exam She is alert and oriented x3 and in no acute  distress Ortho Exam Examination of her right hip shows that moves fluidly.  Her left hip also moves fluidly but has pain on extremes of internal and external rotation as well as over the trochanteric area.  She also has low back pain with attempts at flexion extension mainly in the facet joints which are likely obesity related Specialty Comments:  No specialty comments available.  Imaging: No results found.   PMFS History: Patient Active Problem List   Diagnosis Date Noted  . Unilateral primary osteoarthritis, right hip 12/31/2015  . Status post total replacement of right hip 12/31/2015  . S/P laparoscopic cholecystectomy 06/30/2011  . Gallstones 06/15/2011  . Hypothyroidism 06/08/2011  . Hyperlipemia 06/08/2011  . Arthritis 06/08/2011  . 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 Seasonal allergies 06/08/2011    . Diverticulitis 06/08/2011  . HIGH BLOOD PRESSURE 07/25/2006   Past Medical History:  Diagnosis Date  . Arthritis    osteoarthritis -back, right hip, knees.  . Diabetes mellitus   . Diverticulitis   . Gallstones    resolved with surgery  . H/O seasonal allergies   . Headache    not migraine level  . Hyperlipidemia   . Hypertension   . Hypothyroidism   . Neuromuscular disorder (HCC)    charco-marie tooth disease   . PONV (postoperative nausea and vomiting)   . Thyroid disease     Family History  Problem Relation Age of Onset  . Heart disease Father   . Other Mother     Past Surgical History:  Procedure Laterality Date  . CHOLECYSTECTOMY  06/21/2011   Procedure: LAPAROSCOPIC CHOLECYSTECTOMY WITH INTRAOPERATIVE CHOLANGIOGRAM;  Surgeon: Valarie MerinoMatthew B Martin, MD;  Location: [facility];  Service: General;  Laterality: N/A;  . NOSE SURGERY     removal of bone spur  . TOTAL HIP ARTHROPLASTY Right 12/31/2015   Procedure: RIGHT TOTAL HIP ARTHROPLASTY ANTERIOR APPROACH;  Surgeon: Kathryne Hitchhristopher Y Tricia Pledger, MD;  Location: [facility];  Service: Orthopedics;  Laterality: Right;  . WISDOM TOOTH EXTRACTION     Social History   Occupational History  . Not on file  Tobacco Use  . Smoking status: Never Smoker  . Smokeless tobacco: Never Used  Substance and Sexual Activity  . Alcohol use: No  . Drug use: No  . Sexual activity: Yes    Birth control/protection: None

## 2017-02-13 ENCOUNTER — Other Ambulatory Visit (INDEPENDENT_AMBULATORY_CARE_PROVIDER_SITE_OTHER): Payer: Self-pay

## 2017-02-13 DIAGNOSIS — M25552 Pain in left hip: Secondary | ICD-10-CM

## 2017-02-14 ENCOUNTER — Encounter: Payer: Self-pay | Admitting: Vascular Surgery

## 2017-02-14 ENCOUNTER — Ambulatory Visit (HOSPITAL_COMMUNITY)
Admission: RE | Admit: 2017-02-14 | Discharge: 2017-02-14 | Disposition: A | Discharge status: Home / Self Care | Payer: Medicare Other | Source: Ambulatory Visit | Attending: Vascular Surgery | Admitting: Vascular Surgery

## 2017-02-14 ENCOUNTER — Other Ambulatory Visit: Payer: Self-pay

## 2017-02-14 ENCOUNTER — Ambulatory Visit (INDEPENDENT_AMBULATORY_CARE_PROVIDER_SITE_OTHER): Payer: Medicare Other | Admitting: Vascular Surgery

## 2017-02-14 VITALS — BP 124/74 | HR 102 | Temp 98.2°F | Resp 16 | Ht 67.0 in | Wt 304.0 lb

## 2017-02-14 DIAGNOSIS — R9430 Abnormal result of cardiovascular function study, unspecified: Secondary | ICD-10-CM | POA: Diagnosis not present

## 2017-02-14 DIAGNOSIS — R609 Edema, unspecified: Secondary | ICD-10-CM | POA: Diagnosis not present

## 2017-02-14 DIAGNOSIS — L97509 Non-pressure chronic ulcer of other part of unspecified foot with unspecified severity: Secondary | ICD-10-CM | POA: Diagnosis not present

## 2017-02-14 DIAGNOSIS — R600 Localized edema: Secondary | ICD-10-CM

## 2017-02-14 NOTE — Progress Notes (Signed)
Patient ID: Jacqueline Ayala, female   DOB: 04/03/1965, 52 y.o.   MRN: [id number]  Reason for Consult: New Patient (Initial Visit) (Bilat Edema wth stasis ulcers.  Dr. Dwana MelenaZack Hall [phone].)   Referred by Benita StabileHall, John Z, MD  Subjective:     HPI:  Jacqueline Ayala is a 52 y.o. female with a history of a neuromuscular disorder as well as diabetes hyperlipidemia and hypertension.  She does walk does not have any pain in her legs has not had tissue loss or ulceration.  She does have ongoing swelling of her bilateral lower extremities associated with purple discoloration.  States that both alleviated with elevation of her legs and the purple discoloration gets better with activity although the swelling gets worse.  She cannot tell me exactly when this started.  She does have some muscle wasting distally as well.  She has never been a smoker she has never had lower extremity surgery she does not take any blood thinners.  Past Medical History:  Diagnosis Date  . Arthritis    osteoarthritis -back, right hip, knees.  . Diabetes mellitus   . Diverticulitis   . Gallstones    resolved with surgery  . H/O seasonal allergies   . Headache    not migraine level  . Hyperlipidemia   . Hypertension   . Hypothyroidism   . Neuromuscular disorder (HCC)    charco-marie tooth disease   . PONV (postoperative nausea and vomiting)   . Thyroid disease    Family History  Problem Relation Age of Onset  . Heart disease Father   . Other Mother    Past Surgical History:  Procedure Laterality Date  . CHOLECYSTECTOMY  06/21/2011   Procedure: LAPAROSCOPIC CHOLECYSTECTOMY WITH INTRAOPERATIVE CHOLANGIOGRAM;  Surgeon: Valarie MerinoMatthew B Martin, MD;  Location: [facility];  Service: General;  Laterality: N/A;  . NOSE SURGERY     removal of bone spur  . TOTAL HIP ARTHROPLASTY Right 12/31/2015   Procedure: RIGHT TOTAL HIP ARTHROPLASTY ANTERIOR APPROACH;  Surgeon: Kathryne Hitchhristopher Y Blackman, MD;  Location: [facility];  Service:  Orthopedics;  Laterality: Right;  . WISDOM TOOTH EXTRACTION      Short Social History:  Social History   Tobacco Use  . Smoking status: Never Smoker  . Smokeless tobacco: Never Used  Substance Use Topics  . Alcohol use: No    Allergies  Allergen Reactions  . Codeine Nausea And Vomiting  . Ibuprofen     Upset stomach with long term use    Current Outpatient Medications  Medication Sig Dispense Refill  . aspirin 81 MG chewable tablet Chew 1 tablet (81 mg total) by mouth 2 (two) times daily. 36 tablet 0  . diphenhydrAMINE (BENADRYL) 25 MG tablet Take 50 mg by mouth at bedtime as needed for sleep.    Marland Kitchen. levothyroxine (SYNTHROID, LEVOTHROID) 125 MCG tablet Take 250 mcg by mouth daily before breakfast. Brand name Synthroid    . meloxicam (MOBIC) 7.5 MG tablet Take 1 tablet (7.5 mg total) by mouth daily. 60 tablet 3  . metFORMIN (GLUCOPHAGE-XR) 500 MG 24 hr tablet Take 1,000 mg by mouth 2 (two) times daily.    Marland Kitchen. telmisartan (MICARDIS) 80 MG tablet Take 80 mg by mouth daily.    . TORSEMIDE PO Take 60 mg by mouth.    . traMADol (ULTRAM) 50 MG tablet Take 2 tablets (100 mg total) by mouth 3 (three) times daily as needed. 60 tablet 0   No current facility-administered medications for this  visit.     Review of Systems  Constitutional:  Constitutional negative. Respiratory: Respiratory negative.  Cardiovascular: Positive for leg swelling.  GI: Gastrointestinal negative.  Musculoskeletal: Positive for leg pain.  Neurological: Positive for focal weakness.  Hematologic: Hematologic/lymphatic negative.  Psychiatric: Psychiatric negative.        Objective:  Objective   Vitals:   02/14/17 1504  BP: 124/74  Pulse: (!) 102  Resp: 16  Temp: 98.2 F (36.8 C)  TempSrc: Oral  SpO2: (!) 89%  Weight: (!) 304 lb (137.9 kg)  Height: 5\' 7"  (1.702 m)   Body mass index is 47.61 kg/m.  Physical Exam  Constitutional: She appears well-developed.  HENT:  Head: Normocephalic.  Eyes:  Pupils are equal, round, and reactive to light.  Neck: Normal range of motion.  Cardiovascular: Normal rate.  Pulses:      Popliteal pulses are 2+ on the right side, and 2+ on the left side.  Pulmonary/Chest: Effort normal.  Abdominal: Soft.  Musculoskeletal:  Minimal edema ble Purple discoloration that improves with elevation of feet  Skin: Skin is warm and dry.  Psychiatric: She has a normal mood and affect. Her behavior is normal. Judgment and thought content normal.    Data: Apparently interpreted her bilateral lower extremity reflux studies which demonstrate only reflux in her right common femoral vein.  Saphenofemoral junction on the right 0.53 cm and on the left 0.52 cm.     Assessment/Plan:     52 year old female with history of bilateral lower extremity swelling purple discoloration of her feet.  Both of these seem to get better with elevation of her legs.  I have also prescribed for her compression stockings and she should walk as much as tolerated.  Weight loss  will also be very helpful for her swelling.  She can follow-up on a as needed basis.     Maeola Harman MD [facility]

## 2017-02-27 ENCOUNTER — Ambulatory Visit (INDEPENDENT_AMBULATORY_CARE_PROVIDER_SITE_OTHER): Payer: Medicare Other | Admitting: Physical Medicine and Rehabilitation

## 2017-03-06 ENCOUNTER — Encounter: Payer: Medicare Other | Admitting: Vascular Surgery

## 2017-03-06 ENCOUNTER — Encounter (HOSPITAL_COMMUNITY): Payer: Medicare Other

## 2017-03-12 ENCOUNTER — Ambulatory Visit (INDEPENDENT_AMBULATORY_CARE_PROVIDER_SITE_OTHER): Payer: Medicare Other | Admitting: Orthopaedic Surgery

## 2017-03-20 ENCOUNTER — Ambulatory Visit (INDEPENDENT_AMBULATORY_CARE_PROVIDER_SITE_OTHER): Payer: Medicare Other | Admitting: Physician Assistant

## 2017-03-20 ENCOUNTER — Encounter (INDEPENDENT_AMBULATORY_CARE_PROVIDER_SITE_OTHER): Payer: Self-pay | Admitting: Physician Assistant

## 2017-03-20 DIAGNOSIS — M25552 Pain in left hip: Secondary | ICD-10-CM | POA: Diagnosis not present

## 2017-03-20 MED ORDER — TRAMADOL HCL 50 MG PO TABS: 100.0000 mg | ORAL_TABLET | Freq: Three times a day (TID) | ORAL | 0 refills | Status: AC | PRN

## 2017-03-20 NOTE — Progress Notes (Signed)
HPI: [name] returns today follow-up of her left hip.  She was last seen by Dr. Magnus IvanBlackman on 02/12/2017 and given a trochanteric injection she states between that and the Mobic and tramadol her hip pain is gone down to a 3 out of 10 pain at worst.  She canceled the intra-articular injection in the left hip.  She is asking for refill on tramadol today.  States the pain is bearable at this time.  Review of systems: No radicular symptoms down either leg.  Positive for left hip pain.  Otherwise review systems negative  Physical exam right hip excellent range of motion without pain.  Left hip she has decreased internal rotation and external rotation.  Minimal tenderness over the trochanteric region.  She ambulates without any assistive device.  Plan: This point time we will see how she does on the Mobic refill on the tramadol was given.  If her pain increases in regards to the hip she can always call and we can reschedule an intra-articular injection.  Questions were encouraged and answered at length today.  She will follow-up on an as-needed basis.

## 2017-09-26 ENCOUNTER — Ambulatory Visit (INDEPENDENT_AMBULATORY_CARE_PROVIDER_SITE_OTHER): Payer: Medicare Other | Admitting: Podiatry

## 2017-09-26 ENCOUNTER — Encounter: Payer: Self-pay | Admitting: Podiatry

## 2017-09-26 VITALS — BP 130/74 | HR 86

## 2017-09-26 DIAGNOSIS — M79674 Pain in right toe(s): Secondary | ICD-10-CM | POA: Diagnosis not present

## 2017-09-26 DIAGNOSIS — G600 Hereditary motor and sensory neuropathy: Secondary | ICD-10-CM | POA: Diagnosis not present

## 2017-09-26 DIAGNOSIS — M79675 Pain in left toe(s): Secondary | ICD-10-CM

## 2017-09-26 DIAGNOSIS — B351 Tinea unguium: Secondary | ICD-10-CM

## 2017-09-26 NOTE — Progress Notes (Signed)
This patient presents to the office with chief complaint of long thick nails and diabetic feet.  This patient  says there  is  no pain and discomfort in her  feet.  This patient says there are long thick painful nails. She says she used to have the nails treated in a salon until she developed infected legs.  She stopped going to the nail salons. These nails are painful walking and wearing shoes.  Patient has no history of infection or drainage from both feet.  Patient is unable to  self treat his own nails . This patient presents  to the office today for treatment of the  long nails and a foot evaluation due to history of  Diabetes.  She also has CMT and admits to being worked up for vascular studies by Dr. Margo A[place]Hall in [place]. She has history of hip replacement.  She has been evaluated for venous reflux.  General Appearance  Alert, conversant and in no acute stress.  Vascular  Dorsalis pedis and posterior tibial  pulses are not  palpable  bilaterally.  Capillary return is within normal limits  bilaterally. Cold feet  B/L.  Swollen feet  B/L.  Purplish feet  B/L  bilaterally.  Neurologic  Senn-Weinstein monofilament wire test within normal limits  bilaterally. Muscle power within normal limits bilaterally.  Nails Thick disfigured discolored nails with subungual debris  from hallux to fifth toes bilaterally. No evidence of bacterial infection or drainage bilaterally.  Orthopedic  No limitations of motion of motion feet .  No crepitus or effusions noted.  No bony pathology or digital deformities noted.  Skin  normotropic skin with no porokeratosis noted bilaterally.  No signs of infections or ulcers noted.     Onychomycosis  Diabetes with no foot complications.  IE  Debride nails x 10.  A diabetic foot exam was performed and there is no evidence of any vascular or neurologic pathology.   RTC 3 months. I am concerned about her purplish discoloration despite having her vascular studies being  normal.   Helane GuntherGregory Dashawn Bartnick DPM

## 2017-12-25 ENCOUNTER — Ambulatory Visit: Payer: Medicare Other | Admitting: Podiatry

## 2018-01-11 ENCOUNTER — Ambulatory Visit (INDEPENDENT_AMBULATORY_CARE_PROVIDER_SITE_OTHER): Payer: Medicare Other | Admitting: Podiatry

## 2018-01-11 ENCOUNTER — Encounter: Payer: Self-pay | Admitting: Podiatry

## 2018-01-11 DIAGNOSIS — M79674 Pain in right toe(s): Secondary | ICD-10-CM

## 2018-01-11 DIAGNOSIS — G600 Hereditary motor and sensory neuropathy: Secondary | ICD-10-CM

## 2018-01-11 DIAGNOSIS — M79675 Pain in left toe(s): Secondary | ICD-10-CM

## 2018-01-11 DIAGNOSIS — E119 Type 2 diabetes mellitus without complications: Secondary | ICD-10-CM

## 2018-01-11 DIAGNOSIS — B351 Tinea unguium: Secondary | ICD-10-CM

## 2018-01-12 ENCOUNTER — Encounter: Payer: Self-pay | Admitting: Podiatry

## 2018-01-12 NOTE — Progress Notes (Signed)
Complaint:  Visit Type: Patient returns to my office for continued preventative foot care services. Complaint: Patient states" my nails have grown long and thick and become painful to walk and wear shoes" Patient has been diagnosed with DM with no foot complications.  Patient has also been diagnosed with CMT. The patient presents for preventative foot care services. No changes to ROS  Podiatric Exam: Vascular: dorsalis pedis and posterior tibial pulses are not  palpable bilateral due to swelling. Capillary return is immediate. Cold feet  B/L.Marland Kitchen Purplish feet noted. Sensorium: Normal Semmes Weinstein monofilament test. Normal tactile sensation bilaterally. Nail Exam: Pt has thick disfigured discolored nails with subungual debris noted bilateral entire nail hallux through fifth toenails Ulcer Exam: There is no evidence of ulcer or pre-ulcerative changes or infection. Orthopedic Exam: Muscle tone and strength are WNL. No limitations in general ROM. No crepitus or effusions noted. Foot type and digits show no abnormalities. Bony prominences are unremarkable. Skin: No Porokeratosis. No infection or ulcers  Diagnosis:  Onychomycosis, , Pain in right toe, pain in left toes  Treatment & Plan Procedures and Treatment: Consent by patient was obtained for treatment procedures.   Debridement of mycotic and hypertrophic toenails, 1 through 5 bilateral and clearing of subungual debris. No ulceration, no infection noted.  Return Visit-Office Procedure: Patient instructed to return to the office for a follow up visit 3 months for continued evaluation and treatment.    Helane Gunther DPM

## 2018-04-12 ENCOUNTER — Ambulatory Visit: Payer: Medicare Other | Admitting: Podiatry

## 2018-06-20 ENCOUNTER — Other Ambulatory Visit (INDEPENDENT_AMBULATORY_CARE_PROVIDER_SITE_OTHER): Payer: Self-pay | Admitting: Orthopaedic Surgery

## 2018-07-08 ENCOUNTER — Other Ambulatory Visit (HOSPITAL_COMMUNITY): Payer: Self-pay | Admitting: Internal Medicine

## 2018-07-08 DIAGNOSIS — R0902 Hypoxemia: Secondary | ICD-10-CM

## 2018-07-15 ENCOUNTER — Other Ambulatory Visit (HOSPITAL_COMMUNITY): Payer: Self-pay | Admitting: Adult Health Nurse Practitioner

## 2018-07-15 ENCOUNTER — Other Ambulatory Visit: Payer: Self-pay

## 2018-07-15 ENCOUNTER — Ambulatory Visit (HOSPITAL_COMMUNITY)
Admission: RE | Admit: 2018-07-15 | Discharge: 2018-07-15 | Disposition: A | Discharge status: Home / Self Care | Payer: Medicare Other | Source: Ambulatory Visit | Attending: Internal Medicine | Admitting: Internal Medicine

## 2018-07-15 ENCOUNTER — Ambulatory Visit (HOSPITAL_COMMUNITY)
Admission: RE | Admit: 2018-07-15 | Discharge: 2018-07-15 | Disposition: A | Discharge status: Home / Self Care | Payer: Medicare Other | Source: Ambulatory Visit | Attending: Adult Health Nurse Practitioner | Admitting: Adult Health Nurse Practitioner

## 2018-07-15 DIAGNOSIS — R0902 Hypoxemia: Secondary | ICD-10-CM | POA: Diagnosis not present

## 2018-07-15 DIAGNOSIS — J9601 Acute respiratory failure with hypoxia: Secondary | ICD-10-CM | POA: Diagnosis not present

## 2018-08-06 ENCOUNTER — Other Ambulatory Visit (HOSPITAL_COMMUNITY): Payer: Self-pay | Admitting: Adult Health Nurse Practitioner

## 2018-08-06 ENCOUNTER — Other Ambulatory Visit: Payer: Self-pay | Admitting: Adult Health Nurse Practitioner

## 2018-08-06 DIAGNOSIS — R0601 Orthopnea: Secondary | ICD-10-CM

## 2018-08-06 DIAGNOSIS — R0902 Hypoxemia: Secondary | ICD-10-CM

## 2018-08-20 ENCOUNTER — Ambulatory Visit (HOSPITAL_COMMUNITY): Payer: Medicare Other

## 2018-08-27 ENCOUNTER — Ambulatory Visit (HOSPITAL_COMMUNITY)
Admission: RE | Admit: 2018-08-27 | Discharge: 2018-08-27 | Disposition: A | Discharge status: Home / Self Care | Payer: Medicare Other | Source: Ambulatory Visit | Attending: Adult Health Nurse Practitioner | Admitting: Adult Health Nurse Practitioner

## 2018-08-27 ENCOUNTER — Other Ambulatory Visit: Payer: Self-pay

## 2018-08-27 DIAGNOSIS — R0902 Hypoxemia: Secondary | ICD-10-CM | POA: Insufficient documentation

## 2018-08-27 DIAGNOSIS — R0601 Orthopnea: Secondary | ICD-10-CM | POA: Insufficient documentation

## 2018-09-18 ENCOUNTER — Ambulatory Visit (INDEPENDENT_AMBULATORY_CARE_PROVIDER_SITE_OTHER): Payer: Medicare Other | Admitting: Internal Medicine

## 2018-09-18 ENCOUNTER — Encounter: Payer: Self-pay | Admitting: Internal Medicine

## 2018-09-18 ENCOUNTER — Other Ambulatory Visit: Payer: Self-pay

## 2018-09-18 VITALS — BP 132/76 | HR 75 | Temp 97.0°F | Ht 65.0 in | Wt 305.8 lb

## 2018-09-18 DIAGNOSIS — G4734 Idiopathic sleep related nonobstructive alveolar hypoventilation: Secondary | ICD-10-CM | POA: Diagnosis not present

## 2018-09-18 DIAGNOSIS — Z23 Encounter for immunization: Secondary | ICD-10-CM

## 2018-09-18 DIAGNOSIS — J9811 Atelectasis: Secondary | ICD-10-CM | POA: Diagnosis not present

## 2018-09-18 DIAGNOSIS — R0609 Other forms of dyspnea: Secondary | ICD-10-CM

## 2018-09-18 DIAGNOSIS — R0602 Shortness of breath: Secondary | ICD-10-CM

## 2018-09-18 DIAGNOSIS — R0902 Hypoxemia: Secondary | ICD-10-CM | POA: Diagnosis not present

## 2018-09-18 NOTE — Addendum Note (Signed)
Addended by: Nena Polio on: 09/18/2018 12:34 PM   Modules accepted: Orders

## 2018-09-18 NOTE — Addendum Note (Signed)
Addended by: Nena Polio on: 09/18/2018 11:33 AM   Modules accepted: Orders

## 2018-09-18 NOTE — Patient Instructions (Addendum)
ICD-10-CM   1. Dyspnea on exertion  R06.09 CT Chest High Resolution    Pulmonary function test  2. Compressive atelectasis  J98.11 CT Chest High Resolution    Pulmonary function test  3. Nocturnal hypoxemia  G47.34 CT Chest High Resolution    Pulmonary function test  4. Exercise hypoxemia  R09.02      I do not think you have pulmonary fibrosis but is still possible -  we do need to do a better CT scan to rule that out  I think we have to to work through your shortness of breath  And oxygen drop and understand what is causing that   -Which I think is multifactorial and could be weight, deconditioning, compressive atelectasis, diastolic dysfunction, and pulmonary venous hypertension and charcot marie tooth  Plan - start portable o2 2-3 LNC (use [employer] per your request; meet [facility] [place] [facility]) - REfer Dr Loralie Champagne or [facility] for right heart cath  -Repeat high-resolution CT chest supine and prone [hopefully can do the prone version] -end of October 2020 -Do full pulmonary function test with bronchodilator response -end of October 2020 - do ABG test anytime between now and next visit -Continue to use nighttime oxygen   Follow-up -End of October 2020 but after the above tests  - at followup repeat simple walk test with walker on rA  - we will walk through the desaturations to see if they improve and if they do can signify atlectasis  *test can be done at [facility] if closer

## 2018-09-18 NOTE — Addendum Note (Signed)
Addended by: Nena Polio on: 09/18/2018 11:20 AM   Modules accepted: Orders

## 2018-09-18 NOTE — Progress Notes (Addendum)
OV 09/18/2018  Subjective:  Patient ID: Jacqueline Ayala, female , DOB: Oct 16, 1965 , age 53 y.o. , MRN: [id number] , ADDRESS: [address] [place] [address]   09/18/2018 -   Chief Complaint  Patient presents with   Consult - Pulmonary Fibrosis     HPI Jacqueline Ayala 53 y.o. 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  She reports stable weight for the last 4 years.  3 to 4 years ago she had hip replacement and since then has been using a walker for better balance and cane.  Approximately around this time she noticed sudden onset of shortness of breath but since then she has had exertional shortness of breath that is progressive particularly in the last 1 year.  Class III activities make her dyspneic.  Relieved by rest.  She was subjected to a sleep study apparently in Jan  2019 per chart review.  She had apnea hypotony index of 3.9 but severe desaturations at night.  Since then she is using nocturnal oxygen.  She tells me that in July 2020 she had a respiratory exacerbation on a chest x-ray she was diagnosed with pneumonia that is not COVID.  She got better with antibiotics..  She was never hospitalized.  After that in August 27, 2018 she had a CT scan of the chest that is reported below that I personally visualized and to me shows atelectasis but there was concern from the primary team that this might be pulmonary fibrosis and therefore she is been referred here.  She reports continued progressive dyspnea.  She says she needs a walker to do a walk test.  She only has occasional coughing or wheezing when she has respiratory exacerbation.  No associated chest pain.  She had an echocardiogram July 2020 that I reviewed the report and she has normal ejection fraction.  No comment about diastolic dysfunction.  No pedal edema but she has significant orthopnea and she is now sleeping on a recliner for the last few  to several months.   In the office today when the CMA walked her she immediately desaturated but seem to come up but the CMA reported that she needed 2 or 3 L to maintain saturations.  In addition lab review documented below shows slow steady increase in carbon dioxide levels as of 2017.Marland Kitchen  Patient herself tells me that since 2015 or so she has had persistent high carbon dioxide levels in her chemistries in the 30s.  CT chest 08/27/2018 IMPRESSION: 1. Low volume examination with bibasilar bandlike scarring or atelectasis. No acute appearing airspace disease.  2.  Hepatic steatosis.   Electronically Signed   By: Lauralyn Primes M.D.   On: 08/28/2018 08:46   Results for JERELEAN, GLOSSON (MRN [id number]) as of 09/18/2018 10:18  Ref. Range 05/28/2015 09:17  FVC-Pre Latest Units: L 2.13  FVC-%Pred-Pre Latest Units: % 56  FEV1-Pre Latest Units: L 1.79  FEV1-%Pred-Pre Latest Units: % 60  Pre FEV1/FVC ratio Latest Units: % 84   Results for TEJUANA, BITTER (MRN [id number]) as of 09/18/2018 10:18  Ref. Range 05/28/2015 09:17  TLC Latest Units: L 4.56  TLC % pred Latest Units: % 86   ROS - per HPI  Results for KENDLE, PESKIN (MRN [id number]) as of 09/18/2018 10:18  Ref. Range 12/24/2015 12:00  Hemoglobin Latest Ref Range: 12.0 - 15.0 g/dL 84.5  Results for Ayala, Jacqueline M (  MRN [id number]) as of 09/18/2018 10:18  Ref. Range 12/24/2015 12:00  Creatinine Latest Ref Range: 0.44 - 1.00 mg/dL 4.090.67   Results for Christoper FabianO'BRIANT, Jacqueline M (MRN [id number]) as of 09/18/2018 11:26  Ref. Range 06/19/2011 14:50 06/21/2011 19:25 06/22/2011 03:48 12/24/2015 12:00 01/01/2016 04:40  CO2 Latest Ref Range: 22 - 32 mmol/L 28  27 31  32    has a past medical history of Arthritis, Diabetes mellitus, Diverticulitis, Gallstones, H/O seasonal allergies, Headache, Hyperlipidemia, Hypertension, Hypothyroidism, Neuromuscular disorder (HCC), PONV (postoperative nausea and vomiting), and Thyroid disease.   reports  that she has never smoked. She has never used smokeless tobacco.  Past Surgical History:  Procedure Laterality Date   CHOLECYSTECTOMY  06/21/2011   Procedure: LAPAROSCOPIC CHOLECYSTECTOMY WITH INTRAOPERATIVE CHOLANGIOGRAM;  Surgeon: Valarie MerinoMatthew B Martin, MD;  Location: [facility];  Service: General;  Laterality: N/A;   NOSE SURGERY     removal of bone spur   TOTAL HIP ARTHROPLASTY Right 12/31/2015   Procedure: RIGHT TOTAL HIP ARTHROPLASTY ANTERIOR APPROACH;  Surgeon: Kathryne Hitchhristopher Y Blackman, MD;  Location: [facility];  Service: Orthopedics;  Laterality: Right;   WISDOM TOOTH EXTRACTION      Allergies  Allergen Reactions   Codeine Nausea And Vomiting   Ibuprofen Other (See Comments)    Upset stomach with long term use Upset stomach with long term use     There is no immunization history on file for this patient.  Family History  Problem Relation Age of Onset   Heart disease Father    Other Mother      Current Outpatient Medications:    ANORO ELLIPTA 62.5-25 MCG/INH AEPB, Inhale 1 puff into the lungs daily., Disp: , Rfl:    aspirin 81 MG chewable tablet, Chew 1 tablet (81 mg total) by mouth 2 (two) times daily., Disp: 36 tablet, Rfl: 0   BYSTOLIC 10 MG tablet, TK 1 T PO D, Disp: , Rfl: 2   clobetasol cream (TEMOVATE) 0.05 %, as needed., Disp: , Rfl:    diphenhydrAMINE (BENADRYL) 25 MG tablet, Take 50 mg by mouth at bedtime as needed for sleep., Disp: , Rfl:    esomeprazole (NEXIUM) 20 MG capsule, Take 20 mg by mouth daily at 12 noon., Disp: , Rfl:    loratadine (CLARITIN) 10 MG tablet, Take 10 mg by mouth daily., Disp: , Rfl:    meloxicam (MOBIC) 7.5 MG tablet, TAKE 1 TABLET BY MOUTH DAILY, Disp: 60 tablet, Rfl: 3   metFORMIN (GLUCOPHAGE-XR) 500 MG 24 hr tablet, Take 1,000 mg by mouth 2 (two) times daily., Disp: , Rfl:    Omega-3 Fatty Acids (FISH OIL) 1000 MG CAPS, Take by mouth 2 (two) times daily., Disp: , Rfl:    rosuvastatin (CRESTOR) 5 MG tablet, Take 1 tablet by  mouth daily., Disp: , Rfl:    TORSEMIDE PO, Take 60 mg by mouth., Disp: , Rfl:    traMADol (ULTRAM) 50 MG tablet, Take 2 tablets (100 mg total) by mouth 3 (three) times daily as needed. (Patient taking differently: Take 100 mg by mouth 3 (three) times daily. ), Disp: 60 tablet, Rfl: 0   levothyroxine (SYNTHROID) 200 MCG tablet, Take 1 tablet by mouth daily., Disp: , Rfl:       Objective:   Vitals:   09/18/18 1010  BP: 132/76  Pulse: 75  Temp: (!) 97 F (36.1 C)  SpO2: 93%  Weight: (!) 305 lb 12.8 oz (138.7 kg)  Height: 5\' 5"  (1.651 m)    Estimated body mass index  is 50.89 kg/m as calculated from the following:   Height as of this encounter: 5\' 5"  (1.651 m).   Weight as of this encounter: 305 lb 12.8 oz (138.7 kg).  @WEIGHTCHANGE @  [employer]   09/18/18 1010  Weight: (!) 305 lb 12.8 oz (138.7 kg)     Physical Exam   General Appearance:    Alert, cooperative, no distress, appears stated age - yes , Deconditioned looking - yes , OBESE  - yes, Sitting on Wheelchair -  No but has cane  Head:    Normocephalic, without obvious abnormality, atraumatic  Eyes:    PERRL, conjunctiva/corneas clear,  Ears:    Normal TM's and external ear canals, both ears  Nose:   Nares normal, septum midline, mucosa normal, no drainage    or sinus tenderness. OXYGEN ON  - no . Patient is @ ra   Throat:   Lips, mucosa, and tongue normal; teeth and gums normal. Cyanosis on lips - no  Neck:   Supple, symmetrical, trachea midline, no adenopathy;    thyroid:  no enlargement/tenderness/nodules; no carotid   bruit or JVD  Back:     Symmetric, no curvature, ROM normal, no CVA tenderness  Lungs:     Distress - no , Wheeze no, Barrell Chest - no, Purse lip breathing - no, Crackles - no   Chest Wall:    No tenderness or deformity.    Heart:    Regular rate and rhythm, S1 and S2 normal, no rub   or gallop, Murmur - no  Breast Exam:    NOT DONE  Abdomen:     Soft, non-tender, bowel sounds active all  four quadrants,    no masses, no organomegaly. Visceral obesity - yes  Genitalia:   NOT DONE  Rectal:   NOT DONE  Extremities:   Extremities - normal, Has Cane - YES, Clubbing - no, Edema - no  Pulses:   2+ and symmetric all extremities  Skin:   Stigmata of Connective Tissue Disease - no  Lymph nodes:   Cervical, supraclavicular, and axillary nodes normal  Psychiatric:  Neurologic:   Pleasant - yes, Anxious - no, Flat affect - no  CAm-ICU - neg, Alert and Oriented x 3 - yes, Moves all 4s - yes, Speech - normal, Cognition - intact           Assessment:       ICD-10-CM   1. Dyspnea on exertion  R06.09 CT Chest High Resolution    Pulmonary function test  2. Compressive atelectasis  J98.11 CT Chest High Resolution    Pulmonary function test  3. Nocturnal hypoxemia  G47.34 CT Chest High Resolution    Pulmonary function test  4. Exercise hypoxemia  R09.02        Plan:     Patient Instructions     ICD-10-CM   1. Dyspnea on exertion  R06.09 CT Chest High Resolution    Pulmonary function test  2. Compressive atelectasis  J98.11 CT Chest High Resolution    Pulmonary function test  3. Nocturnal hypoxemia  G47.34 CT Chest High Resolution    Pulmonary function test  4. Exercise hypoxemia  R09.02      I do not think you have pulmonary fibrosis but is still possible -  we do need to do a better CT scan to rule that out  I think we have to to work through your shortness of breath  And oxygen drop and understand what is  causing that   -Which I think is multifactorial and could be weight, deconditioning, compressive atelectasis, diastolic dysfunction, and pulmonary venous hypertension and charcot marie tooth  Plan - start portable o2 2-3 LNC (use [employer] per your request; [facility]) - REfer Dr Marca Anconaalton [facility] or [facility] for right heart cath  -Repeat high-resolution CT chest supine and prone [hopefully can do the prone version] -end of October 2020 -Do full pulmonary function test  with bronchodilator response -end of October 2020 - do ABG test anytime between now and next visit -Continue to use nighttime oxygen   Follow-up -End of October 2020 but after the above tests  - at followup repeat simple walk test with walker on rA  - we will walk through the desaturations to see if they improve and if they do can signify atlectasis  *[facility] if closer      SIGNATURE    Dr. Kalman ShanMurali Tashema Tiller, M.D., F.C.C.P,  Pulmonary and Critical Care Medicine Staff Physician, [facility] Center Director - Interstitial Lung Disease  Program  Pulmonary Fibrosis [facility] at [facility] [place], [place], [address]  Pager: [phone], If no answer or between  15:00h - 7:00h: call [phone] Telephone: [phone]  11:27 AM 09/18/2018

## 2018-09-18 NOTE — Addendum Note (Signed)
Addended by: Parke Poisson E on: 09/18/2018 02:30 PM   Modules accepted: Orders

## 2018-09-24 ENCOUNTER — Ambulatory Visit (HOSPITAL_COMMUNITY): Payer: Medicare Other

## 2018-10-09 ENCOUNTER — Other Ambulatory Visit: Payer: Self-pay | Admitting: Internal Medicine

## 2018-10-16 ENCOUNTER — Other Ambulatory Visit (HOSPITAL_COMMUNITY): Payer: Self-pay | Admitting: Respiratory Therapy

## 2018-10-16 ENCOUNTER — Other Ambulatory Visit: Payer: Self-pay

## 2018-10-16 ENCOUNTER — Ambulatory Visit (HOSPITAL_COMMUNITY)
Admission: RE | Admit: 2018-10-16 | Discharge: 2018-10-16 | Disposition: A | Discharge status: Home / Self Care | Payer: Medicare Other | Source: Ambulatory Visit | Attending: Internal Medicine | Admitting: Internal Medicine

## 2018-10-16 DIAGNOSIS — R0600 Dyspnea, unspecified: Secondary | ICD-10-CM | POA: Insufficient documentation

## 2018-10-16 DIAGNOSIS — R0902 Hypoxemia: Secondary | ICD-10-CM | POA: Insufficient documentation

## 2018-10-16 DIAGNOSIS — R0609 Other forms of dyspnea: Secondary | ICD-10-CM

## 2018-10-16 DIAGNOSIS — G4734 Idiopathic sleep related nonobstructive alveolar hypoventilation: Secondary | ICD-10-CM | POA: Diagnosis present

## 2018-10-16 LAB — BLOOD GAS, ARTERIAL
Acid-Base Excess: 11.2 mmol/L — ABNORMAL HIGH (ref 0.0–2.0)
Bicarbonate: 33.1 mmol/L — ABNORMAL HIGH (ref 20.0–28.0)
FIO2: 21
O2 Saturation: 85.9 %
Patient temperature: 37
pCO2 arterial: 63.4 mmHg — ABNORMAL HIGH (ref 32.0–48.0)
pH, Arterial: 7.38 (ref 7.350–7.450)
pO2, Arterial: 52.5 mmHg — ABNORMAL LOW (ref 83.0–108.0)

## 2018-10-21 ENCOUNTER — Ambulatory Visit (HOSPITAL_COMMUNITY): Payer: Medicare Other

## 2018-10-23 ENCOUNTER — Other Ambulatory Visit (HOSPITAL_COMMUNITY)
Admission: RE | Admit: 2018-10-23 | Discharge: 2018-10-23 | Disposition: A | Discharge status: Home / Self Care | Payer: Medicare Other | Source: Ambulatory Visit | Attending: Internal Medicine | Admitting: Internal Medicine

## 2018-10-23 ENCOUNTER — Other Ambulatory Visit: Payer: Self-pay

## 2018-10-23 DIAGNOSIS — Z01812 Encounter for preprocedural laboratory examination: Secondary | ICD-10-CM | POA: Insufficient documentation

## 2018-10-23 DIAGNOSIS — Z20828 Contact with and (suspected) exposure to other viral communicable diseases: Secondary | ICD-10-CM | POA: Insufficient documentation

## 2018-10-23 LAB — SARS CORONAVIRUS 2 (TAT 6-24 HRS): SARS Coronavirus 2: NEGATIVE

## 2018-10-28 ENCOUNTER — Encounter: Payer: Self-pay | Admitting: Internal Medicine

## 2018-10-28 ENCOUNTER — Other Ambulatory Visit: Payer: Self-pay

## 2018-10-28 ENCOUNTER — Ambulatory Visit (INDEPENDENT_AMBULATORY_CARE_PROVIDER_SITE_OTHER): Payer: Medicare Other | Admitting: Internal Medicine

## 2018-10-28 VITALS — BP 118/74 | HR 83 | Temp 97.5°F | Ht 65.5 in | Wt 310.0 lb

## 2018-10-28 DIAGNOSIS — J9611 Chronic respiratory failure with hypoxia: Secondary | ICD-10-CM | POA: Diagnosis not present

## 2018-10-28 DIAGNOSIS — Z23 Encounter for immunization: Secondary | ICD-10-CM

## 2018-10-28 DIAGNOSIS — J9811 Atelectasis: Secondary | ICD-10-CM

## 2018-10-28 DIAGNOSIS — J9612 Chronic respiratory failure with hypercapnia: Secondary | ICD-10-CM

## 2018-10-28 DIAGNOSIS — R0600 Dyspnea, unspecified: Secondary | ICD-10-CM

## 2018-10-28 DIAGNOSIS — R1310 Dysphagia, unspecified: Secondary | ICD-10-CM | POA: Diagnosis not present

## 2018-10-28 DIAGNOSIS — G4734 Idiopathic sleep related nonobstructive alveolar hypoventilation: Secondary | ICD-10-CM

## 2018-10-28 DIAGNOSIS — G600 Hereditary motor and sensory neuropathy: Secondary | ICD-10-CM | POA: Diagnosis not present

## 2018-10-28 DIAGNOSIS — R0609 Other forms of dyspnea: Secondary | ICD-10-CM

## 2018-10-28 LAB — PULMONARY FUNCTION TEST
DL/VA % pred: 132 %
DL/VA: 5.63 ml/min/mmHg/L
DLCO unc % pred: 69 %
DLCO unc: 15.23 ml/min/mmHg
FEF 25-75 Post: 1.95 L/sec
FEF 25-75 Pre: 1.11 L/sec
FEF2575-%Change-Post: 76 %
FEF2575-%Pred-Post: 69 %
FEF2575-%Pred-Pre: 39 %
FEV1-%Change-Post: 15 %
FEV1-%Pred-Post: 43 %
FEV1-%Pred-Pre: 37 %
FEV1-Post: 1.27 L
FEV1-Pre: 1.1 L
FEV1FVC-%Change-Post: 5 %
FEV1FVC-%Pred-Pre: 104 %
FEV6-%Change-Post: 9 %
FEV6-%Pred-Post: 40 %
FEV6-%Pred-Pre: 36 %
FEV6-Post: 1.45 L
FEV6-Pre: 1.32 L
FEV6FVC-%Pred-Post: 102 %
FEV6FVC-%Pred-Pre: 102 %
FVC-%Change-Post: 9 %
FVC-%Pred-Post: 39 %
FVC-%Pred-Pre: 35 %
FVC-Post: 1.45 L
FVC-Pre: 1.32 L
Post FEV1/FVC ratio: 88 %
Post FEV6/FVC ratio: 100 %
Pre FEV1/FVC ratio: 83 %
Pre FEV6/FVC Ratio: 100 %
RV % pred: 90 %
RV: 1.71 L
TLC % pred: 59 %
TLC: 3.16 L

## 2018-10-28 NOTE — Patient Instructions (Addendum)
Charcot Marie Tooth muscular atrophy Dysphagia, unspecified type  - refer Dr Benjamine Sprague of neurology  Chronic respiratory failure with hypoxia and hypercapnia ([place])  -Lung function has declined in the absence of weight gain I think this is because of progressive neuromuscular Charcot-Marie-Tooth disease  Plan - you do need dietary weight loss -Start nocturnal BiPAP/trilogy -At some point we will do neuromuscular testing of your respiratory muscles -Okay to hold off on referral to cardiology for heart catheterization -Continue night oxygen and daytime oxygen with rest and  exertion 2L (CMA to ensure this) -Pneumovax 23 10/28/2018   Follow-up  -4-8 weeks or sooner if needed

## 2018-10-28 NOTE — Progress Notes (Signed)
OV 09/18/2018  Subjective:  Patient ID: Jacqueline FabianCandace M Ayala, female , DOB: 07/11/1965 , age 53 y.o. , MRN: [id number] , [address]ESS: [address] [place] [address]   09/18/2018 -   Chief Complaint  Patient presents with   Consult - Pulmonary Fibrosis     HPI [name] 53 y.o. 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  She reports stable weight for the last 4 years.  3 to 4 years ago she had hip replacement and since then has been using a walker for better balance and cane.  Approximately around this time she noticed sudden onset of shortness of breath but since then she has had exertional shortness of breath that is progressive particularly in the last 1 year.  Class III activities make her dyspneic.  Relieved by rest.  She was subjected to a sleep study apparently in Jan  2019 per chart review.  She had apnea hypotony index of 3.9 but severe desaturations at night.  Since then she is using nocturnal oxygen.  She tells me that in July 2020 she had a respiratory exacerbation on a chest x-ray she was diagnosed with pneumonia that is not COVID.  She got better with antibiotics..  She was never hospitalized.  After that in August 27, 2018 she had a CT scan of the chest that is reported below that I personally visualized and to me shows atelectasis but there was concern from the primary team that this might be pulmonary fibrosis and therefore she is been referred here.  She reports continued progressive dyspnea.  She says she needs a walker to do a walk test.  She only has occasional coughing or wheezing when she has respiratory exacerbation.  No associated chest pain.  She had an echocardiogram July 2020 that I reviewed the report and she has normal ejection fraction.  No comment about diastolic dysfunction.  No pedal edema but she has significant orthopnea and she is now sleeping on a recliner for the last few  to several months.   In the office today when the CMA walked her she immediately desaturated but seem to come up but the CMA reported that she needed 2 or 3 L to maintain saturations.  In addition lab review documented below shows slow steady increase in carbon dioxide levels as of 2017.Marland Kitchen.  Patient herself tells me that since 2015 or so she has had persistent high carbon dioxide levels in her chemistries in the 30s.  CT chest 08/27/2018 IMPRESSION: 1. Low volume examination with bibasilar bandlike scarring or atelectasis. No acute appearing airspace disease.  2.  Hepatic steatosis.   Electronically Signed   By: Lauralyn PrimesAlex  Bibbey M.D.   On: 08/28/2018 08:46    OV 10/28/2018  Subjective:  Patient ID: Jacqueline Fabianandace M Ayala, female , DOB: 04/10/1965 , age 53 y.o. , MRN: [id number] , [address]ESS: [address] [place] [address]   10/28/2018 -   Chief Complaint  Patient presents with   Follow-up    Patient reports that she still has sob with exertion. She reports that she does not see a change with the Anoro inhaler.    Morbidly obese female with Charcot-Marie-Tooth disease here for dyspnea and hypoxemia evaluation  HPI Jacqueline Ayala 53 y.o. -presents for follow-up.  She is here to review her results.  She says for some reason her referral to cardiology for right heart catheterization did not happen.  Nevertheless at this point she has had a blood gas and also lung function studies.  The blood gas shows hypercapnia with hypoxemia on room air.  Her lung function studies show decline in her FVC TLC and DLCO.  She did not have a high-resolution CT chest because she said she could not lie flat.  In the interim there are no other problems.  She tells me that despite lung function decline she is actually not gained weight.  She is also reporting dysphagia for water on and off.  She feels that her neuromuscular dystrophy is getting worse.  She likes to have a local neurologist for  this.   Results for AURORAH, SCHLACHTER (MRN [id number]) as of 10/28/2018 10:28  Ref. Range 10/16/2018 09:07  FIO2 Unknown 21.00  pH, Arterial Latest Ref Range: 7.350 - 7.450  7.380  pCO2 arterial Latest Ref Range: 32.0 - 48.0 mmHg 63.4 (H)  pO2, Arterial Latest Ref Range: 83.0 - 108.0 mmHg 52.5 (L)   Results for IMOGINE, CARVELL (MRN [id number]) as of 10/28/2018 10:28  Ref. Range 05/28/2015 09:17 10/28/2018 08:47  FVC-Pre Latest Units: L 2.13 1.32  FVC-%Pred-Pre Latest Units: % 56 35  Results for JOE, TANNEY (MRN [id number]) as of 10/28/2018 10:28  Ref. Range 05/28/2015 09:17 10/28/2018 08:47  TLC Latest Units: L 4.56 3.16  TLC % pred Latest Units: % 86 59  Results for NATAUSHA, JUNGWIRTH (MRN [id number]) as of 10/28/2018 10:28  Ref. Range 05/28/2015 09:17 10/28/2018 08:47  DLCO unc Latest Units: ml/min/mmHg 19.57 15.23  DLCO unc % pred Latest Units: % 74 69   ROS - per HPI     has a past medical history of Arthritis, Diabetes mellitus, Diverticulitis, Gallstones, H/O seasonal allergies, Headache, Hyperlipidemia, Hypertension, Hypothyroidism, Neuromuscular disorder (HCC), PONV (postoperative nausea and vomiting), and Thyroid disease.   reports that she has never smoked. She has never used smokeless tobacco.  Past Surgical History:  Procedure Laterality Date   CHOLECYSTECTOMY  06/21/2011   Procedure: LAPAROSCOPIC CHOLECYSTECTOMY WITH INTRAOPERATIVE CHOLANGIOGRAM;  Surgeon: Valarie Merino, MD;  Location: [facility];  Service: General;  Laterality: N/A;   NOSE SURGERY     removal of bone spur   TOTAL HIP ARTHROPLASTY Right 12/31/2015   Procedure: RIGHT TOTAL HIP ARTHROPLASTY ANTERIOR APPROACH;  Surgeon: Kathryne Hitch, MD;  Location: [facility];  Service: Orthopedics;  Laterality: Right;   WISDOM TOOTH EXTRACTION      Allergies  Allergen Reactions   Codeine Nausea And Vomiting   Ibuprofen Other (See Comments)    Upset stomach with long term use Upset stomach  with long term use    Immunization History  Administered Date(s) Administered   Influenza,inj,Quad PF,6+ Mos 09/18/2018    Family History  Problem Relation Age of Onset   Heart disease Father    Other Mother      Current Outpatient Medications:    ANORO ELLIPTA 62.5-25 MCG/INH AEPB, Inhale 1 puff into the lungs daily., Disp: , Rfl:    aspirin 81 MG chewable tablet, Chew 1 tablet (81 mg total) by mouth 2 (two) times daily., Disp: 36 tablet, Rfl: 0   BYSTOLIC 10 MG tablet, TK 1 T PO D, Disp: , Rfl: 2   clobetasol cream (TEMOVATE) 0.05 %, as needed., Disp: , Rfl:    diphenhydrAMINE (BENADRYL) 25 MG tablet, Take 50 mg by mouth at bedtime as needed for sleep., Disp: , Rfl:    esomeprazole (NEXIUM) 20 MG capsule, Take 20 mg by mouth  daily at 12 noon., Disp: , Rfl:    levothyroxine (SYNTHROID) 200 MCG tablet, Take 1 tablet by mouth daily., Disp: , Rfl:    loratadine (CLARITIN) 10 MG tablet, Take 10 mg by mouth daily., Disp: , Rfl:    meloxicam (MOBIC) 7.5 MG tablet, TAKE 1 TABLET BY MOUTH DAILY, Disp: 60 tablet, Rfl: 3   metFORMIN (GLUCOPHAGE-XR) 500 MG 24 hr tablet, Take 1,000 mg by mouth 2 (two) times daily., Disp: , Rfl:    Omega-3 Fatty Acids (FISH OIL) 1000 MG CAPS, Take by mouth 2 (two) times daily., Disp: , Rfl:    rosuvastatin (CRESTOR) 5 MG tablet, Take 1 tablet by mouth daily., Disp: , Rfl:    TORSEMIDE PO, Take 60 mg by mouth., Disp: , Rfl:    traMADol (ULTRAM) 50 MG tablet, Take 2 tablets (100 mg total) by mouth 3 (three) times daily as needed. (Patient taking differently: Take 100 mg by mouth 3 (three) times daily. ), Disp: 60 tablet, Rfl: 0      Objective:   Vitals:   10/28/18 1016 10/28/18 1017  BP:  118/74  Pulse:  83  Temp: (!) 97.5 F (36.4 C)   TempSrc: Temporal   SpO2:  90%  Weight: (!) 140.6 kg   Height: 5' 5.5" (1.664 m)     Estimated body mass index is 50.8 kg/m as calculated from the following:   Height as of this encounter: 5' 5.5"  (1.664 m).   Weight as of this encounter: 140.6 kg.  @WEIGHTCHANGE @  [employer]   10/28/18 1016  Weight: (!) 140.6 kg     Physical Exam Morbidly obese has a walker.  Mostly discussion visit       Assessment:       ICD-10-CM   1. Charcot Marie Tooth muscular atrophy  G60.0   2. Dysphagia, unspecified type  R13.10   3. Chronic respiratory failure with hypoxia and hypercapnia (HCC)  J96.11    J96.12        Patient's chronic respiratory failure due to  Charcot Marie Tooth Neuromuscular disease is life threatening.  Previous ABG's above  have documented high PCO2 and spirometry reveals severe obstructive and restrictive ventilatory defect.  Patient would benefit from non-invasive ventilation.  Without this therapy, the patient is at high risk of ending up with worsening symptoms, worsened respiratory failure, need for ER visits and/or recurrent hospitalizations.  Bilevel device unable to adequately support patient's nocturnal ventilation needs.  Patient would benefit from NIV therapy with set tidal volumes and pressure.     Plan:     Patient Instructions  Charcot 10/30/18 Tooth muscular atrophy Dysphagia, unspecified type  - refer Dr Hilda Lias of neurology  Chronic respiratory failure with hypoxia and hypercapnia (HCC)  -Lung function has declined in the absence of weight gain I think this is because of progressive neuromuscular Charcot-Marie-Tooth disease  Plan - you do need dietary weight loss -Start nocturnal BiPAP/trilogy -At some point we will do neuromuscular testing of your respiratory muscles -Okay to hold off on referral to cardiology for heart catheterization -Continue night oxygen and daytime oxygen with rest and  exertion 2L (CMA to ensure this) -Pneumovax 23 10/28/2018   Follow-up  -4-8 weeks or sooner if needed  > 50% of this > 25 min visit spent in face to face counseling or coordination of care - by this undersigned MD - Dr 10/30/2018. This  includes one or more of the following documented above: discussion of test results,  diagnostic or treatment recommendations, prognosis, risks and benefits of management options, instructions, education, compliance or risk-factor reduction   SIGNATURE    Dr. Brand Males, M.D., F.C.C.P,  Pulmonary and Critical Care Medicine Staff Physician, [place] Director - Interstitial Lung Disease  Program  Pulmonary [place] at [place], [place], [address]  Pager: [phone], If no answer or between  15:00h - 7:00h: call [phone] Telephone: [phone]  10:46 AM 10/28/2018

## 2018-10-28 NOTE — Progress Notes (Signed)
PFT done today. 

## 2018-10-28 NOTE — Addendum Note (Signed)
Addended by: Hildred Alamin I on: 10/28/2018 12:34 PM   Modules accepted: Orders

## 2018-10-29 ENCOUNTER — Telehealth: Payer: Self-pay | Admitting: Internal Medicine

## 2018-10-29 ENCOUNTER — Encounter: Payer: Self-pay | Admitting: Neurology

## 2018-10-29 DIAGNOSIS — G600 Hereditary motor and sensory neuropathy: Secondary | ICD-10-CM

## 2018-10-29 NOTE — Telephone Encounter (Signed)
Called the number given for Ashly at [facility] but it was the wrong number. Called the main number for [employer] [phone] and spoke with Gilda. She stated that Ashly's number was [phone].   Spoke with Ashly at [facility]. He stated that [employer] has received the order for the bipap. He was reviewing the patient's OV note from yesterday and saw a mention that the patient would not benefit from a bipap but would benefit from a NIV. He has faxed over a form for a NIV MR to sign. He stated that based on the patient's diagnosis of Charcot Lelan Pons Tooth muscular atrophy and her recent ABG, she will qualify for the NIV. Will check to see if this form is up front.   He also received an order for 24/7 oxygen from the patient's PCP Dr. Nevada Crane. The oxygen order did not have a valid diagnosis for the oxygen. He was wondering if MR had any recommendations based off of the patient's PFT. He stated that a diagnosis of COPD or asthma would help with the diagnosis.   MR, please advise.

## 2018-10-29 NOTE — Telephone Encounter (Signed)
Pt calling back about oxygen order - pt can be reached at [phone]

## 2018-10-30 NOTE — Telephone Encounter (Signed)
Called and spoke to Jacqueline Ayala at [facility]. He is going to check and get back with [place] about the diagnosis of obesity hypoventilation for the oxygen.  He also wanted to make sure that the NIV form has been received and when Dr. Chase Caller signs and dates to have the date match what is already on the form please.  Routing to [place] to follow up on as she is working with him the next several days.

## 2018-10-30 NOTE — Telephone Encounter (Signed)
Ashly returned regarding pt and would like a call back

## 2018-10-30 NOTE — Telephone Encounter (Signed)
Ok to place order for o2 with charcot marie tooth  I will sign NIV form 10/31/18

## 2018-10-30 NOTE — Telephone Encounter (Signed)
Yes - please change to NIV - I will sign form  Regarding o2 - please ask if obesity hypoventilation will suffice

## 2018-10-30 NOTE — Telephone Encounter (Signed)
Will go ahead and place O2 order with the new diagnosis.

## 2018-10-30 NOTE — Telephone Encounter (Signed)
Spoke with Jacqueline Ayala and notified of recs per MR  He states that we can actually use the pt's dx of charcot marie tooth syndrome for her o2 and wants [place] to place order for this and make sure we put that pt needs POC  Please advise if okay to place the order and advise if you have signed the form yet for NIV thanks

## 2018-11-26 ENCOUNTER — Ambulatory Visit: Payer: Medicare Other | Admitting: Internal Medicine

## 2018-12-06 ENCOUNTER — Encounter: Payer: Self-pay | Admitting: Neurology

## 2018-12-06 ENCOUNTER — Ambulatory Visit (INDEPENDENT_AMBULATORY_CARE_PROVIDER_SITE_OTHER): Payer: Medicare Other | Admitting: Neurology

## 2018-12-06 ENCOUNTER — Other Ambulatory Visit: Payer: Self-pay

## 2018-12-06 VITALS — BP 126/73 | HR 84 | Ht 65.5 in | Wt 291.2 lb

## 2018-12-06 DIAGNOSIS — G600 Hereditary motor and sensory neuropathy: Secondary | ICD-10-CM | POA: Diagnosis not present

## 2018-12-06 NOTE — Progress Notes (Signed)
[facility] Clinic Note - Initial Visit   Date: 12/06/18  Jacqueline Ayala MRN: [id number] DOB: May 02, 1965   Dear Dr. Chase Caller:   Thank you for your kind referral of [place] for consultation of Charcot Lelan Pons Tooth disease. Although her history is well known to you, please allow [place] to reiterate it for the purpose of our medical record. The patient was accompanied to the clinic by self.    History of Present Illness: Jacqueline Ayala is a 53 y.o. 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  She has a strong family history of Charcot-Marie-Tooth with her mother and maternal grandfather both having the disease.  Her mother was wheelchair-bound and died at the age of 8 and her maternal grandfather was ambulatory with a walker in his late 71s.  Mother was affected by the disease much earlier and had much severe disease as compared to the patient.  Patient reports having symptoms of foot drop in high school and was diagnosed at [facility] by genetic testing.  She was followed at the [facility] they intermittently over the years last in 2016.  She has bilateral foot drop and weakness/atrophy in the hands.  Over the past several years, she has noticed gradual progression of neuropathy such that she uses both hands to lift objects and fine motor tasks are harder to perform.   She has AFO, but does not use them.  She tends to prefer shoe inserts.  She has been using a walker since 2016 due to right hip pain, which she had surgery the following year.   She also has low back pain and continued to use the walker which helps.  Her neuropathy extends up to the mid-calf and into the mid-forearm.    Over the past 4 years, she has developed shortness of breath with exertion and orthopnea.  She uses 2-3L oxygen when exerted, none at rest.   She sleeps on a recliner for the past several years. She is being evaluated by Dr. Chase Caller for this.  She was noted to have low FVC TLC and DLCO on PFTs and hypercarbia.  There was concern of pulmonary fibrosis, but she was unable to lay supine for high resolution CT scan.   Sometimes she has problems swallowing water, this is not common.  She has problems with solids.  She denies any facial weakness, double vision, or ptosis.   She has two children who do manifest any symptoms of CMT.    Disabled since 2004 for CMT.  She lives with husband and 2 children in a two level home.     Out-side paper records, electronic medical record, and images have been reviewed where available and summarized as:  Lab Results  Component Value Date   HGBA1C 5.9 (H) 12/24/2015   Past Medical History:  Diagnosis Date  . Arthritis    osteoarthritis -back, right hip, knees.  . Diabetes mellitus   . Diverticulitis   . Gallstones    resolved with surgery  . H/O seasonal allergies   . Headache    not migraine level  . Hyperlipidemia   . Hypertension   . Hypothyroidism   . Neuromuscular disorder ([place])    charco-marie tooth disease   . PONV (postoperative nausea and vomiting)   . Thyroid disease     Past Surgical History:  Procedure Laterality Date  . CHOLECYSTECTOMY  06/21/2011   Procedure: LAPAROSCOPIC  CHOLECYSTECTOMY WITH INTRAOPERATIVE CHOLANGIOGRAM;  Surgeon: Valarie Merino, MD;  Location: [facility];  Service: General;  Laterality: N/A;  . NOSE SURGERY     removal of bone spur  . TOTAL HIP ARTHROPLASTY Right 12/31/2015   Procedure: RIGHT TOTAL HIP ARTHROPLASTY ANTERIOR APPROACH;  Surgeon: Kathryne Hitch, MD;  Location: [facility];  Service: Orthopedics;  Laterality: Right;  . WISDOM TOOTH EXTRACTION       Medications:  Outpatient Encounter Medications as of 12/06/2018  Medication Sig  . ANORO ELLIPTA 62.5-25 MCG/INH AEPB Inhale 1 puff into the lungs daily.  Marland Kitchen aspirin 81 MG chewable tablet  Chew 1 tablet (81 mg total) by mouth 2 (two) times daily.  Marland Kitchen BYSTOLIC 10 MG tablet TK 1 T PO D  . clobetasol cream (TEMOVATE) 0.05 % as needed.  Marland Kitchen levothyroxine (SYNTHROID) 200 MCG tablet Take 1 tablet by mouth daily.  Marland Kitchen loratadine (CLARITIN) 10 MG tablet Take 10 mg by mouth daily.  . meloxicam (MOBIC) 7.5 MG tablet TAKE 1 TABLET BY MOUTH DAILY  . metFORMIN (GLUCOPHAGE-XR) 500 MG 24 hr tablet Take 1,000 mg by mouth 2 (two) times daily.  . Omega-3 Fatty Acids (FISH OIL) 1000 MG CAPS Take by mouth 2 (two) times daily.  . rosuvastatin (CRESTOR) 5 MG tablet Take 1 tablet by mouth daily.  . TORSEMIDE PO Take 60 mg by mouth.  . traMADol (ULTRAM) 50 MG tablet Take 2 tablets (100 mg total) by mouth 3 (three) times daily as needed. (Patient taking differently: Take 100 mg by mouth 3 (three) times daily. )  . diphenhydrAMINE (BENADRYL) 25 MG tablet Take 50 mg by mouth at bedtime as needed for sleep.  Marland Kitchen esomeprazole (NEXIUM) 20 MG capsule Take 20 mg by mouth daily at 12 noon.   No facility-administered encounter medications on file as of 12/06/2018.     Allergies:  Allergies  Allergen Reactions  . Codeine Nausea And Vomiting  . Ibuprofen Other (See Comments)    Upset stomach with long term use Upset stomach with long term use    Family History: Family History  Problem Relation Age of Onset  . Heart disease Father   . Other Mother     Social History: Social History   Tobacco Use  . Smoking status: Never Smoker  . Smokeless tobacco: Never Used  Substance Use Topics  . Alcohol use: No  . Drug use: No   Social History   Social History Narrative  . Not on file    Review of Systems:  CONSTITUTIONAL: No fevers, chills, night sweats, or weight loss.   EYES: No visual changes or eye pain ENT: No hearing changes.  No history of nose bleeds.   RESPIRATORY: No cough, wheezing +shortness of breath.   CARDIOVASCULAR: Negative for chest pain, and palpitations.   GI: Negative for abdominal  discomfort, blood in stools or black stools.  No recent change in bowel habits.   GU:  No history of incontinence.   MUSCLOSKELETAL: No history of joint pain or swelling.  No myalgias.   SKIN: Negative for lesions, rash, and itching.   HEMATOLOGY/ONCOLOGY: Negative for prolonged bleeding, bruising easily, and swollen nodes.  No history of cancer.   ENDOCRINE: Negative for cold or heat intolerance, polydipsia or goiter.   PSYCH:  No depression or anxiety symptoms.   NEURO: As Above.   Vital Signs:  BP 126/73   Pulse 84   Ht 5' 5.5" (1.664 m)   Wt 291 lb 3.2 oz (132.1  kg)   SpO2 (!) 89%   BMI 47.72 kg/m    General Medical Exam:   General:  Well appearing, obese, comfortable.   Eyes/ENT: see cranial nerve examination.   Neck:   No carotid bruits. Respiratory:  Clear to auscultation, good air entry bilaterally.  She is able to count to 33 on deep inhalation single breath Cardiac:  Regular rate and rhythm, no murmur.   Extremities:  No deformities, edema, or skin discoloration.  Skin:  No rashes or lesions.  Neurological Exam: MENTAL STATUS including orientation to time, place, person, recent and remote memory, attention span and concentration, language, and fund of knowledge is normal.  Speech is not dysarthric.  CRANIAL NERVES: II:  No visual field defects.  III-IV-VI: Pupils equal round and reactive to light.  Normal conjugate, extra-ocular eye movements in all directions of gaze.  No nystagmus.  No ptosis at rest or with upgaze.   V:  Normal facial sensation.    VII:  Normal facial symmetry and movements.  Orbicularis oculi, orbicularis oris, and buccinator are 5/5.   VIII:  Normal hearing and vestibular function.   IX-X:  Normal palatal movement.   XI:  Normal shoulder shrug and head rotation.   XII:  Normal tongue strength and range of motion, no deviation or fasciculation.  MOTOR:  Distal hand and lower leg/feet muscle atrophy.  No fasciculations or abnormal movements.  No  pronator drift.  Neck flexion and extension is 5/5 Upper Extremity:  Right  Left  Deltoid  5/5   5/5   Biceps  5/5   5/5   Triceps  5/5   5/5   Infraspinatus 5/5  5/5  Medial pectoralis 5/5  5/5  Wrist extensors  5/5   5/5   Wrist flexors  5/5   5/5   Finger extensors  5-/5   5-/5   Finger flexors  4/5   4/5   Dorsal interossei  4-/5   4-/5   Abductor pollicis  4/5   4/5   Tone (Ashworth scale)  0  0   Lower Extremity:  Right  Left  Hip flexors  5/5   5/5   Hip extensors  5/5   5/5   Adductor 5/5  5/5  Abductor 5/5  5/5  Knee flexors  5/5   5/5   Knee extensors  5/5   5/5   Dorsiflexors  0/5   0/5   Plantarflexors  3/5   3/5   Toe extensors  0/5   0/5  Toe flexors  0/5  0/5   Tone (Ashworth scale)  0  0   MSRs:  Right        Left                  brachioradialis 2+  2+  biceps 2+  2+  triceps 2+  2+  patellar 2+  2+  ankle jerk 0  0  Hoffman no  no  plantar response down  down   SENSORY:  Vibration reduced at the ankle and MCP bilaterally as compared to the knee and elbow.  Temperature and pin prick is reduced in a gradient pattern in the hand and lower legs.    COORDINATION/GAIT: Normal finger-to- nose-finger.  Finger tapping is mildly slowed.  Unable to rise from a chair without using arms.  Gait is wide-based with bilateral foot drop, high steppage, assisted with walker   IMPRESSION: Charcot Marie Tooth disease type 2D manifesting with sensorimotor  deficits affecting a stocking-glove distrubution.  Symptoms have been slowly progressive.  She uses a walker for gait and although she ha AFO, does not use them due to comfort.  Fortunately, she has not suffered any falls.  She has completed PT/OT in the past and knows the exercises, but admits to being noncompliant with this in the past.  Her paresthesias are not bothersome enough to take medication. She was offered OTC lidocaine ointment for burning pain, when severe. Patient educated on daily foot inspection, fall  prevention, and safety precautions around the home.  Regarding her exertional dyspnea, I do not see this to be related to her CMT this is primarily neuropathy, not muscular, and she does not have any bulbar weakness or evidence of phrenic nerve involvement.  Her truncal strength and proximal strength is intact.  Recommend ongoing exploration for other causes of dyspnea as you are already doing.   Greater than 50% of this 45 minute visit was spent in counseling, explanation of diagnosis, planning of further management, and coordination of care.   Thank you for allowing me to participate in patient's care.  If I can answer any additional questions, I would be pleased to do so.    Sincerely,    Donika K. Allena Katz, DO

## 2018-12-06 NOTE — Patient Instructions (Signed)
Please let me know if you would like to do any occupational or physical therapy  Take extra caution on uneven ground  Check feet daily  You can try lidocaine ointment (Aspercream, Salonpas) to feet when they are burning

## 2019-01-30 ENCOUNTER — Telehealth (HOSPITAL_COMMUNITY): Payer: Self-pay

## 2019-01-30 NOTE — Telephone Encounter (Signed)

## 2019-01-31 ENCOUNTER — Ambulatory Visit (HOSPITAL_COMMUNITY)
Admission: RE | Admit: 2019-01-31 | Discharge: 2019-01-31 | Disposition: A | Discharge status: Home / Self Care | Payer: Medicare Other | Source: Ambulatory Visit | Attending: Cardiology | Admitting: Cardiology

## 2019-01-31 ENCOUNTER — Encounter (HOSPITAL_COMMUNITY): Payer: Self-pay | Admitting: Cardiology

## 2019-01-31 ENCOUNTER — Other Ambulatory Visit (HOSPITAL_COMMUNITY): Payer: Self-pay

## 2019-01-31 ENCOUNTER — Other Ambulatory Visit: Payer: Self-pay

## 2019-01-31 VITALS — BP 135/63 | HR 82 | Wt 318.2 lb

## 2019-01-31 DIAGNOSIS — I509 Heart failure, unspecified: Secondary | ICD-10-CM | POA: Diagnosis not present

## 2019-01-31 DIAGNOSIS — E785 Hyperlipidemia, unspecified: Secondary | ICD-10-CM | POA: Insufficient documentation

## 2019-01-31 DIAGNOSIS — E1142 Type 2 diabetes mellitus with diabetic polyneuropathy: Secondary | ICD-10-CM | POA: Insufficient documentation

## 2019-01-31 DIAGNOSIS — I110 Hypertensive heart disease with heart failure: Secondary | ICD-10-CM | POA: Insufficient documentation

## 2019-01-31 DIAGNOSIS — R0609 Other forms of dyspnea: Secondary | ICD-10-CM | POA: Diagnosis present

## 2019-01-31 DIAGNOSIS — G600 Hereditary motor and sensory neuropathy: Secondary | ICD-10-CM | POA: Insufficient documentation

## 2019-01-31 DIAGNOSIS — Z7982 Long term (current) use of aspirin: Secondary | ICD-10-CM | POA: Diagnosis not present

## 2019-01-31 DIAGNOSIS — I5022 Chronic systolic (congestive) heart failure: Secondary | ICD-10-CM

## 2019-01-31 DIAGNOSIS — I5032 Chronic diastolic (congestive) heart failure: Secondary | ICD-10-CM

## 2019-01-31 DIAGNOSIS — Z79899 Other long term (current) drug therapy: Secondary | ICD-10-CM | POA: Diagnosis not present

## 2019-01-31 DIAGNOSIS — I272 Pulmonary hypertension, unspecified: Secondary | ICD-10-CM | POA: Insufficient documentation

## 2019-01-31 DIAGNOSIS — R0601 Orthopnea: Secondary | ICD-10-CM | POA: Diagnosis not present

## 2019-01-31 DIAGNOSIS — Z8249 Family history of ischemic heart disease and other diseases of the circulatory system: Secondary | ICD-10-CM | POA: Insufficient documentation

## 2019-01-31 DIAGNOSIS — Z79890 Hormone replacement therapy (postmenopausal): Secondary | ICD-10-CM | POA: Insufficient documentation

## 2019-01-31 DIAGNOSIS — E039 Hypothyroidism, unspecified: Secondary | ICD-10-CM | POA: Insufficient documentation

## 2019-01-31 DIAGNOSIS — G709 Myoneural disorder, unspecified: Secondary | ICD-10-CM | POA: Insufficient documentation

## 2019-01-31 DIAGNOSIS — Z7984 Long term (current) use of oral hypoglycemic drugs: Secondary | ICD-10-CM | POA: Diagnosis not present

## 2019-01-31 DIAGNOSIS — Z791 Long term (current) use of non-steroidal anti-inflammatories (NSAID): Secondary | ICD-10-CM | POA: Insufficient documentation

## 2019-01-31 LAB — BASIC METABOLIC PANEL
Anion gap: 10 (ref 5–15)
BUN: 21 mg/dL — ABNORMAL HIGH (ref 6–20)
CO2: 32 mmol/L (ref 22–32)
Calcium: 8.8 mg/dL — ABNORMAL LOW (ref 8.9–10.3)
Chloride: 99 mmol/L (ref 98–111)
Creatinine, Ser: 1.1 mg/dL — ABNORMAL HIGH (ref 0.44–1.00)
GFR calc Af Amer: >60 mL/min (ref >60)
GFR calc non Af Amer: 58 mL/min — ABNORMAL LOW (ref >60)
Glucose, Bld: 103 mg/dL — ABNORMAL HIGH (ref 70–99)
Potassium: 4.4 mmol/L (ref 3.5–5.1)
Sodium: 141 mmol/L (ref 135–145)

## 2019-01-31 LAB — CBC
HCT: 40.2 % (ref 36.0–46.0)
Hemoglobin: 12.2 g/dL (ref 12.0–15.0)
MCH: 30 pg (ref 26.0–34.0)
MCHC: 30.3 g/dL (ref 30.0–36.0)
MCV: 99 fL (ref 80.0–100.0)
Platelets: 345 10*3/uL (ref 150–400)
RBC: 4.06 MIL/uL (ref 3.87–5.11)
RDW: 15.3 % (ref 11.5–15.5)
WBC: 11.7 10*3/uL — ABNORMAL HIGH (ref 4.0–10.5)
nRBC: 0 % (ref 0.0–0.2)

## 2019-01-31 MED ORDER — SODIUM CHLORIDE 0.9% FLUSH: 3.0000 mL | Freq: Two times a day (BID) | INTRAVENOUS | Status: DC

## 2019-01-31 NOTE — Patient Instructions (Signed)
Labs today  We will only contact you if something comes back abnormal or we need to make some changes. Otherwise no news is good news!  Your physician recommends that you schedule a follow-up appointment in: 2 months with Dr Earlean Shawl will need a pre procedure COVID test    WHEN:  Friday, February 5th, 2021 at 1:15pm WHERE: [facility]        [address] [place] [address]  This is a drive thru testing site, you will remain in your car. Be sure to get in the line FOR PROCEDURES Once you have been swabbed you will need to remain home in quarantine until you return for your procedure.     [facility] [address] [id number] [place] [address] Dept: [phone] Loc: [phone]  Jacqueline Ayala  01/31/2019  You are scheduled for a Cardiac Catheterization on Monday, February 8 with Dr. Marca Ancona.  1. Please arrive at the [facility] (Main Entrance A) at [facility]: [address] [place], [place] [address] at 8:00 AM (This time is two hours before your procedure to ensure your preparation). Free valet parking service is available.   Special note: Every effort is made to have your procedure done on time. Please understand that emergencies sometimes delay scheduled procedures.  2. Diet: Do not eat solid foods or drink liquids after midnight.    3. Labs: Drawn today in office for your procedure  4. Medication instructions in preparation for your procedure:   Contrast Allergy: No  HOLD All meds on the morning of your procedure  Do not take Diabetes Med Glucophage (Metformin) on the day of the procedure and HOLD 48 HOURS AFTER THE PROCEDURE.  On the morning of your procedure, take your Aspirin and any morning medicines NOT listed above.  You may use sips of water.  5. Plan for one night stay--bring personal belongings. 6. Bring a current list of your  medications and current insurance cards. 7. You MUST have a responsible person to drive you home. 8. Someone MUST be with you the first 24 hours after you arrive home or your discharge will be delayed. 9. Please wear clothes that are easy to get on and off and wear slip-on shoes.  Thank you for allowing [place] to care for you!   -- [facility]

## 2019-02-02 NOTE — Progress Notes (Signed)
PCP: Celene Squibb, MD Pulmonary: Dr. Chase Caller Cardiology: Dr. Aundra Dubin  54 y.o. with Charcot-Marie-Tooth hereditary peripheral neuropathy and chronic hypoxemic/hypercarbic respiratory failure throught to be due to neuromuscular effect of Charcot-Marie-Tooth disease. She was referred by Dr. Chase Caller to assess for pulmonary hypertension and CHF.  She had PFTs in 10/20 showing severe restriction and obstruction. CT chest in 8/20 showed low volumes, bibasilar scarring vs atelectasis. She has bilateral foot drop and weakness/wasting in her hand muscles.    Patient uses a walker.  She has stable exertional dyspnea walking 50-100 feet with her walker.  She has had slowly progressive dyspnea over 4-5 years.  She does report orthopnea and sleeps essentially sitting up.  No chest pain.  No lightheadedness.  Echo from 7/20 showed normal systolic function with EF 60-65%, normal RV, dilated IVC, PA pressure unable to be estimated.  No chest pain.  No lightheadedness or syncope.   Her mother and maternal grandfather both have Charcot-Marie-tooth disease.  Her mother had an MI at 20.  She is not a smoker.   Labs (1/21): hgb 13.3, K 5.1, creatinine 0.99, LDL 90, TSH normal  PMH:  1. Charcot-Marie-Tooth disease: Hereditary polyneuropathy.  2. Type 2 diabetes 3. HTN 4. Hyperlipidemia 5. Hypothyroidism 6. Chronic hypoxemic and hypercarbic respiratory failure: She uses oxygen at night and with exertion. This is thought to be due to Charcot-Marie-Tooth disease (neuromuscular effect).    - PFTs with severe obstruction and restriction.  - CT chest (8/20): Low lung volumes, bibasilar scarring/atelectasis - Bipap at night.  7. Echo (7/20): EF 60-65%, normal RV, dilated IVC, unable to estimate PA pressure.   Social History   Socioeconomic History  . Marital status: Married    Spouse name: Not on file  . Number of children: Not on file  . Years of education: Not on file  . Highest education level: Not on file   Occupational History  . Occupation: disabled  Tobacco Use  . Smoking status: Never Smoker  . Smokeless tobacco: Never Used  Substance and Sexual Activity  . Alcohol use: No  . Drug use: No  . Sexual activity: Yes    Birth control/protection: None  Other Topics Concern  . Not on file  Social History Narrative  . Not on file   Social Determinants of Health   Financial Resource Strain:   . Difficulty of Paying Living Expenses: Not on file  Food Insecurity:   . Worried About Charity fundraiser in the Last Year: Not on file  . Ran Out of Food in the Last Year: Not on file  Transportation Needs:   . Lack of Transportation (Medical): Not on file  . Lack of Transportation (Non-Medical): Not on file  Physical Activity:   . Days of Exercise per Week: Not on file  . Minutes of Exercise per Session: Not on file  Stress:   . Feeling of Stress : Not on file  Social Connections:   . Frequency of Communication with Friends and Family: Not on file  . Frequency of Social Gatherings with Friends and Family: Not on file  . Attends Religious Services: Not on file  . Active Member of Clubs or Organizations: Not on file  . Attends Archivist Meetings: Not on file  . Marital Status: Not on file  Intimate Partner Violence:   . Fear of Current or Ex-Partner: Not on file  . Emotionally Abused: Not on file  . Physically Abused: Not on file  . Sexually  Abused: Not on file   Family History  Problem Relation Age of Onset  . Heart disease Father   . Other Mother    Current Outpatient Medications  Medication Sig Dispense Refill  . ANORO ELLIPTA 62.5-25 MCG/INH AEPB Inhale 1 puff into the lungs daily.    Marland Kitchen aspirin 81 MG chewable tablet Chew 1 tablet (81 mg total) by mouth 2 (two) times daily. 36 tablet 0  . BYSTOLIC 10 MG tablet TK 1 T PO D  2  . clobetasol cream (TEMOVATE) 0.05 % as needed.    . diphenhydrAMINE (BENADRYL) 25 MG tablet Take 50 mg by mouth at bedtime as needed for  sleep.    Marland Kitchen levothyroxine (SYNTHROID) 200 MCG tablet Take 1 tablet by mouth daily.    Marland Kitchen loratadine (CLARITIN) 10 MG tablet Take 10 mg by mouth daily.    . meloxicam (MOBIC) 7.5 MG tablet TAKE 1 TABLET BY MOUTH DAILY 60 tablet 3  . metFORMIN (GLUCOPHAGE-XR) 500 MG 24 hr tablet Take 1,000 mg by mouth 2 (two) times daily.    . Omega-3 Fatty Acids (FISH OIL) 1000 MG CAPS Take by mouth 2 (two) times daily.    . rosuvastatin (CRESTOR) 5 MG tablet Take 1 tablet by mouth daily.    . TORSEMIDE PO Take 60 mg by mouth.    . traMADol (ULTRAM) 50 MG tablet Take 2 tablets (100 mg total) by mouth 3 (three) times daily as needed. (Patient taking differently: Take 100 mg by mouth 3 (three) times daily. ) 60 tablet 0   No current facility-administered medications for this encounter.   BP 135/63   Pulse 82   Wt (!) 144.3 kg (318 lb 3.2 oz)   SpO2 97%   BMI 52.15 kg/m  General: NAD Neck: No JVD, no thyromegaly or thyroid nodule.  Lungs: Clear to auscultation bilaterally with normal respiratory effort. CV: Nondisplaced PMI.  Heart regular S1/S2, no S3/S4, 1/6 SEM RUSB.  Trace ankle edema.  No carotid bruit.  Normal pedal pulses.  Abdomen: Soft, nontender, no hepatosplenomegaly, no distention.  Skin: Intact without lesions or rashes.  Neurologic: Alert and oriented x 3.  Psych: Normal affect. Extremities: No clubbing or cyanosis.  HEENT: Normal.   Assessment/Plan: 1. Exertional dyspnea: Echo in 7/20 with EF 60-65%, normal RV but IVC dilated, unable to estimate PA systolic pressure. She has Charcot-Marie-Tooth peripheral neuropathy with neuromuscular weakness that may be the main factor in her exertional dyspnea. Severe restriction/obstruction on PFTs in 10/20 fit this diagnosis.  She has to use oxygen at night and with exertion.  Given prominent orthopnea as well as dilated IVC on echo, there is concern for possible cardiac component.  It is certainly possible that she has hypoxemic pulmonary  vasoconstriction with RV failure.  She is on torsemide 60 mg daily.  - Continue current torsemide 60 mg daily for now.  - I will arrange for [facility] to assess her filling pressures and PA pressure to see if anything can be optimized.  I would suspect that if pulmonary hypertension is present, it would be group 3 from hypoxemic pulmonary vasoconstriction.  We discussed risks/benefits and she agrees to procedure.  2. Family history of premature CAD: Mother with MI at 63.  She does not have chest pain.   - Continue statin.  LDL is 90, would keep LDL at least < 90 and ideally < 70.   Marca Ancona 02/02/2019

## 2019-02-02 NOTE — H&P (View-Only) (Signed)
PCP: Hall, John Z, MD Pulmonary: Dr. Ramaswamy Cardiology: Dr. Kraig Genis  53 y.o. with Charcot-Marie-Tooth hereditary peripheral neuropathy and chronic hypoxemic/hypercarbic respiratory failure throught to be due to neuromuscular effect of Charcot-Marie-Tooth disease. She was referred by Dr. Ramaswamy to assess for pulmonary hypertension and CHF.  She had PFTs in 10/20 showing severe restriction and obstruction. CT chest in 8/20 showed low volumes, bibasilar scarring vs atelectasis. She has bilateral foot drop and weakness/wasting in her hand muscles.    Patient uses a walker.  She has stable exertional dyspnea walking 50-100 feet with her walker.  She has had slowly progressive dyspnea over 4-5 years.  She does report orthopnea and sleeps essentially sitting up.  No chest pain.  No lightheadedness.  Echo from 7/20 showed normal systolic function with EF 60-65%, normal RV, dilated IVC, PA pressure unable to be estimated.  No chest pain.  No lightheadedness or syncope.   Her mother and maternal grandfather both have Charcot-Marie-tooth disease.  Her mother had an MI at 48.  She is not a smoker.   Labs (1/21): hgb 13.3, K 5.1, creatinine 0.99, LDL 90, TSH normal  PMH:  1. Charcot-Marie-Tooth disease: Hereditary polyneuropathy.  2. Type 2 diabetes 3. HTN 4. Hyperlipidemia 5. Hypothyroidism 6. Chronic hypoxemic and hypercarbic respiratory failure: She uses oxygen at night and with exertion. This is thought to be due to Charcot-Marie-Tooth disease (neuromuscular effect).    - PFTs with severe obstruction and restriction.  - CT chest (8/20): Low lung volumes, bibasilar scarring/atelectasis - Bipap at night.  7. Echo (7/20): EF 60-65%, normal RV, dilated IVC, unable to estimate PA pressure.   Social History   Socioeconomic History  . Marital status: Married    Spouse name: Not on file  . Number of children: Not on file  . Years of education: Not on file  . Highest education level: Not on file   Occupational History  . Occupation: disabled  Tobacco Use  . Smoking status: Never Smoker  . Smokeless tobacco: Never Used  Substance and Sexual Activity  . Alcohol use: No  . Drug use: No  . Sexual activity: Yes    Birth control/protection: None  Other Topics Concern  . Not on file  Social History Narrative  . Not on file   Social Determinants of Health   Financial Resource Strain:   . Difficulty of Paying Living Expenses: Not on file  Food Insecurity:   . Worried About Running Out of Food in the Last Year: Not on file  . Ran Out of Food in the Last Year: Not on file  Transportation Needs:   . Lack of Transportation (Medical): Not on file  . Lack of Transportation (Non-Medical): Not on file  Physical Activity:   . Days of Exercise per Week: Not on file  . Minutes of Exercise per Session: Not on file  Stress:   . Feeling of Stress : Not on file  Social Connections:   . Frequency of Communication with Friends and Family: Not on file  . Frequency of Social Gatherings with Friends and Family: Not on file  . Attends Religious Services: Not on file  . Active Member of Clubs or Organizations: Not on file  . Attends Club or Organization Meetings: Not on file  . Marital Status: Not on file  Intimate Partner Violence:   . Fear of Current or Ex-Partner: Not on file  . Emotionally Abused: Not on file  . Physically Abused: Not on file  . Sexually   Abused: Not on file   Family History  Problem Relation Age of Onset  . Heart disease Father   . Other Mother    Current Outpatient Medications  Medication Sig Dispense Refill  . ANORO ELLIPTA 62.5-25 MCG/INH AEPB Inhale 1 puff into the lungs daily.    Marland Kitchen aspirin 81 MG chewable tablet Chew 1 tablet (81 mg total) by mouth 2 (two) times daily. 36 tablet 0  . BYSTOLIC 10 MG tablet TK 1 T PO D  2  . clobetasol cream (TEMOVATE) 0.05 % as needed.    . diphenhydrAMINE (BENADRYL) 25 MG tablet Take 50 mg by mouth at bedtime as needed for  sleep.    Marland Kitchen levothyroxine (SYNTHROID) 200 MCG tablet Take 1 tablet by mouth daily.    Marland Kitchen loratadine (CLARITIN) 10 MG tablet Take 10 mg by mouth daily.    . meloxicam (MOBIC) 7.5 MG tablet TAKE 1 TABLET BY MOUTH DAILY 60 tablet 3  . metFORMIN (GLUCOPHAGE-XR) 500 MG 24 hr tablet Take 1,000 mg by mouth 2 (two) times daily.    . Omega-3 Fatty Acids (FISH OIL) 1000 MG CAPS Take by mouth 2 (two) times daily.    . rosuvastatin (CRESTOR) 5 MG tablet Take 1 tablet by mouth daily.    . TORSEMIDE PO Take 60 mg by mouth.    . traMADol (ULTRAM) 50 MG tablet Take 2 tablets (100 mg total) by mouth 3 (three) times daily as needed. (Patient taking differently: Take 100 mg by mouth 3 (three) times daily. ) 60 tablet 0   No current facility-administered medications for this encounter.   BP 135/63   Pulse 82   Wt (!) 144.3 kg (318 lb 3.2 oz)   SpO2 97%   BMI 52.15 kg/m  General: NAD Neck: No JVD, no thyromegaly or thyroid nodule.  Lungs: Clear to auscultation bilaterally with normal respiratory effort. CV: Nondisplaced PMI.  Heart regular S1/S2, no S3/S4, 1/6 SEM RUSB.  Trace ankle edema.  No carotid bruit.  Normal pedal pulses.  Abdomen: Soft, nontender, no hepatosplenomegaly, no distention.  Skin: Intact without lesions or rashes.  Neurologic: Alert and oriented x 3.  Psych: Normal affect. Extremities: No clubbing or cyanosis.  HEENT: Normal.   Assessment/Plan: 1. Exertional dyspnea: Echo in 7/20 with EF 60-65%, normal RV but IVC dilated, unable to estimate PA systolic pressure. She has Charcot-Marie-Tooth peripheral neuropathy with neuromuscular weakness that may be the main factor in her exertional dyspnea. Severe restriction/obstruction on PFTs in 10/20 fit this diagnosis.  She has to use oxygen at night and with exertion.  Given prominent orthopnea as well as dilated IVC on echo, there is concern for possible cardiac component.  It is certainly possible that she has hypoxemic pulmonary  vasoconstriction with RV failure.  She is on torsemide 60 mg daily.  - Continue current torsemide 60 mg daily for now.  - I will arrange for [facility] to assess her filling pressures and PA pressure to see if anything can be optimized.  I would suspect that if pulmonary hypertension is present, it would be group 3 from hypoxemic pulmonary vasoconstriction.  We discussed risks/benefits and she agrees to procedure.  2. Family history of premature CAD: Mother with MI at 63.  She does not have chest pain.   - Continue statin.  LDL is 90, would keep LDL at least < 90 and ideally < 70.   Marca Ancona 02/02/2019

## 2019-02-07 ENCOUNTER — Other Ambulatory Visit (HOSPITAL_COMMUNITY)
Admission: RE | Admit: 2019-02-07 | Discharge: 2019-02-07 | Disposition: A | Discharge status: Home / Self Care | Payer: Medicare Other | Source: Ambulatory Visit | Attending: Cardiology | Admitting: Cardiology

## 2019-02-07 DIAGNOSIS — Z01812 Encounter for preprocedural laboratory examination: Secondary | ICD-10-CM | POA: Insufficient documentation

## 2019-02-07 DIAGNOSIS — Z20822 Contact with and (suspected) exposure to covid-19: Secondary | ICD-10-CM | POA: Diagnosis not present

## 2019-02-07 LAB — SARS CORONAVIRUS 2 (TAT 6-24 HRS): SARS Coronavirus 2: NEGATIVE

## 2019-02-10 ENCOUNTER — Encounter (HOSPITAL_COMMUNITY)
Admission: RE | Disposition: A | Discharge status: Home / Self Care | Payer: Self-pay | Source: Home / Self Care | Attending: Cardiology

## 2019-02-10 ENCOUNTER — Telehealth (HOSPITAL_COMMUNITY): Payer: Self-pay

## 2019-02-10 ENCOUNTER — Other Ambulatory Visit: Payer: Self-pay

## 2019-02-10 ENCOUNTER — Ambulatory Visit (HOSPITAL_COMMUNITY)
Admission: RE | Admit: 2019-02-10 | Discharge: 2019-02-10 | Disposition: A | Discharge status: Home / Self Care | Payer: Medicare Other | Attending: Cardiology | Admitting: Cardiology

## 2019-02-10 DIAGNOSIS — Z7984 Long term (current) use of oral hypoglycemic drugs: Secondary | ICD-10-CM | POA: Insufficient documentation

## 2019-02-10 DIAGNOSIS — Z791 Long term (current) use of non-steroidal anti-inflammatories (NSAID): Secondary | ICD-10-CM | POA: Insufficient documentation

## 2019-02-10 DIAGNOSIS — J9612 Chronic respiratory failure with hypercapnia: Secondary | ICD-10-CM | POA: Diagnosis not present

## 2019-02-10 DIAGNOSIS — E785 Hyperlipidemia, unspecified: Secondary | ICD-10-CM | POA: Insufficient documentation

## 2019-02-10 DIAGNOSIS — Z8249 Family history of ischemic heart disease and other diseases of the circulatory system: Secondary | ICD-10-CM | POA: Diagnosis not present

## 2019-02-10 DIAGNOSIS — Z7982 Long term (current) use of aspirin: Secondary | ICD-10-CM | POA: Insufficient documentation

## 2019-02-10 DIAGNOSIS — G600 Hereditary motor and sensory neuropathy: Secondary | ICD-10-CM | POA: Insufficient documentation

## 2019-02-10 DIAGNOSIS — I110 Hypertensive heart disease with heart failure: Secondary | ICD-10-CM | POA: Diagnosis not present

## 2019-02-10 DIAGNOSIS — Z79899 Other long term (current) drug therapy: Secondary | ICD-10-CM | POA: Insufficient documentation

## 2019-02-10 DIAGNOSIS — I509 Heart failure, unspecified: Secondary | ICD-10-CM

## 2019-02-10 DIAGNOSIS — E039 Hypothyroidism, unspecified: Secondary | ICD-10-CM | POA: Insufficient documentation

## 2019-02-10 DIAGNOSIS — Z9981 Dependence on supplemental oxygen: Secondary | ICD-10-CM | POA: Diagnosis not present

## 2019-02-10 DIAGNOSIS — I5032 Chronic diastolic (congestive) heart failure: Secondary | ICD-10-CM

## 2019-02-10 DIAGNOSIS — E1142 Type 2 diabetes mellitus with diabetic polyneuropathy: Secondary | ICD-10-CM | POA: Diagnosis not present

## 2019-02-10 DIAGNOSIS — Z79890 Hormone replacement therapy (postmenopausal): Secondary | ICD-10-CM | POA: Insufficient documentation

## 2019-02-10 HISTORY — PX: RIGHT HEART CATH: CATH118263

## 2019-02-10 LAB — GLUCOSE, CAPILLARY: Glucose-Capillary: 133 mg/dL — ABNORMAL HIGH (ref 70–99)

## 2019-02-10 LAB — POCT I-STAT EG7
Acid-Base Excess: 7 mmol/L — ABNORMAL HIGH (ref 0.0–2.0)
Acid-Base Excess: 7 mmol/L — ABNORMAL HIGH (ref 0.0–2.0)
Bicarbonate: 34.1 mmol/L — ABNORMAL HIGH (ref 20.0–28.0)
Bicarbonate: 34.3 mmol/L — ABNORMAL HIGH (ref 20.0–28.0)
Calcium, Ion: 1.15 mmol/L (ref 1.15–1.40)
Calcium, Ion: 1.17 mmol/L (ref 1.15–1.40)
HCT: 39 % (ref 36.0–46.0)
HCT: 39 % (ref 36.0–46.0)
Hemoglobin: 13.3 g/dL (ref 12.0–15.0)
Hemoglobin: 13.3 g/dL (ref 12.0–15.0)
O2 Saturation: 71 %
O2 Saturation: 72 %
Potassium: 3.7 mmol/L (ref 3.5–5.1)
Potassium: 3.8 mmol/L (ref 3.5–5.1)
Sodium: 140 mmol/L (ref 135–145)
Sodium: 141 mmol/L (ref 135–145)
TCO2: 36 mmol/L — ABNORMAL HIGH (ref 22–32)
TCO2: 36 mmol/L — ABNORMAL HIGH (ref 22–32)
pCO2, Ven: 58.7 mmHg (ref 44.0–60.0)
pCO2, Ven: 59.5 mmHg (ref 44.0–60.0)
pH, Ven: 7.369 (ref 7.250–7.430)
pH, Ven: 7.372 (ref 7.250–7.430)
pO2, Ven: 40 mmHg (ref 32.0–45.0)
pO2, Ven: 40 mmHg (ref 32.0–45.0)

## 2019-02-10 SURGERY — RIGHT HEART CATH
Anesthesia: LOCAL

## 2019-02-10 MED ORDER — LABETALOL HCL 5 MG/ML IV SOLN: 10.0000 mg | INTRAVENOUS | Status: DC | PRN

## 2019-02-10 MED ORDER — ONDANSETRON HCL 4 MG/2ML IJ SOLN
INTRAMUSCULAR | Status: AC
  Filled 2019-02-10: qty 2

## 2019-02-10 MED ORDER — MIDAZOLAM HCL 2 MG/2ML IJ SOLN
INTRAMUSCULAR | Status: DC | PRN
  Administered 2019-02-10: 1 mg via INTRAVENOUS

## 2019-02-10 MED ORDER — SODIUM CHLORIDE 0.9 % IV SOLN: 250.0000 mL | INTRAVENOUS | Status: DC | PRN

## 2019-02-10 MED ORDER — MIDAZOLAM HCL 2 MG/2ML IJ SOLN
INTRAMUSCULAR | Status: AC
  Filled 2019-02-10: qty 2

## 2019-02-10 MED ORDER — SODIUM CHLORIDE 0.9% FLUSH: 3.0000 mL | INTRAVENOUS | Status: DC | PRN

## 2019-02-10 MED ORDER — HEPARIN (PORCINE) IN NACL 1000-0.9 UT/500ML-% IV SOLN
INTRAVENOUS | Status: DC | PRN
  Administered 2019-02-10: 500 mL

## 2019-02-10 MED ORDER — ACETAMINOPHEN 325 MG PO TABS: 650.0000 mg | ORAL_TABLET | ORAL | Status: DC | PRN

## 2019-02-10 MED ORDER — SODIUM CHLORIDE 0.9 % IV SOLN: INTRAVENOUS | Status: DC

## 2019-02-10 MED ORDER — LIDOCAINE HCL (PF) 1 % IJ SOLN
INTRAMUSCULAR | Status: DC | PRN
  Administered 2019-02-10: 2 mL via SUBCUTANEOUS

## 2019-02-10 MED ORDER — SODIUM CHLORIDE 0.9% FLUSH: 3.0000 mL | Freq: Two times a day (BID) | INTRAVENOUS | Status: DC

## 2019-02-10 MED ORDER — ONDANSETRON HCL 4 MG/2ML IJ SOLN
INTRAMUSCULAR | Status: DC | PRN
  Administered 2019-02-10: 4 mg via INTRAVENOUS

## 2019-02-10 MED ORDER — HEPARIN (PORCINE) IN NACL 1000-0.9 UT/500ML-% IV SOLN
INTRAVENOUS | Status: AC
  Filled 2019-02-10: qty 500

## 2019-02-10 MED ORDER — ASPIRIN 81 MG PO CHEW
CHEWABLE_TABLET | ORAL | Status: AC
  Filled 2019-02-10: qty 1

## 2019-02-10 MED ORDER — TORSEMIDE 20 MG PO TABS: 80.0000 mg | ORAL_TABLET | Freq: Every day | ORAL | 3 refills | Status: AC

## 2019-02-10 MED ORDER — HYDRALAZINE HCL 20 MG/ML IJ SOLN: 10.0000 mg | INTRAMUSCULAR | Status: DC | PRN

## 2019-02-10 MED ORDER — ONDANSETRON HCL 4 MG/2ML IJ SOLN: 4.0000 mg | Freq: Four times a day (QID) | INTRAMUSCULAR | Status: DC | PRN

## 2019-02-10 MED ORDER — LIDOCAINE HCL (PF) 1 % IJ SOLN
INTRAMUSCULAR | Status: AC
  Filled 2019-02-10: qty 30

## 2019-02-10 MED ORDER — ASPIRIN 81 MG PO CHEW
81.0000 mg | CHEWABLE_TABLET | ORAL | Status: AC
  Administered 2019-02-10: 09:00:00 81 mg via ORAL

## 2019-02-10 SURGICAL SUPPLY — 7 items
CATH BALLN WEDGE 5F 110CM (CATHETERS) ×2 IMPLANT
GUIDEWIRE .025 260CM (WIRE) ×2 IMPLANT
PACK CARDIAC CATHETERIZATION (CUSTOM PROCEDURE TRAY) ×2 IMPLANT
SHEATH GLIDE SLENDER 4/5FR (SHEATH) ×2 IMPLANT
TRANSDUCER W/STOPCOCK (MISCELLANEOUS) ×2 IMPLANT
TUBING ART PRESS 72  MALE/FEM (TUBING) ×1
TUBING ART PRESS 72 MALE/FEM (TUBING) ×1 IMPLANT

## 2019-02-10 NOTE — Telephone Encounter (Signed)
No answer, left message to return call

## 2019-02-10 NOTE — Discharge Instructions (Signed)
Venogram A venogram, or venography, is a procedure that uses an X-ray and dye (contrast) to examine how well the veins work and how blood flows through them. Contrast helps the veins show up on X-rays. A venogram may be done:  To evaluate abnormalities in the vein.  To identify clots within veins, such as deep vein thrombosis (DVT).  To map out the veins that might be needed for another procedure. Tell a health care provider about:  Any allergies you have, especially to medicines, shellfish, iodine, and contrast.  All medicines you are taking, including vitamins, herbs, eye drops, creams, and over-the-counter medicines.  Any problems you or family members have had with anesthetic medicines.  Any blood disorders you have.  Any surgeries you have had and any complications that occurred.  Any medical conditions you have.  Whether you are pregnant, may be pregnant, or are breastfeeding.  Any history of smoking or tobacco use. What are the risks? Generally, this is a safe procedure. However, problems may occur, including:  Infection.  Bleeding.  Blood clots.  Allergic reaction to medicines or contrast.  Damage to other structures or organs.  Kidney problems.  Increased risk of cancer. Being exposed to too much radiation over a lifetime can increase the risk of cancer. The risk is small. What happens before the procedure? Medicines Ask your health care provider about:  Changing or stopping your regular medicines. This is especially important if you are taking diabetes medicines or blood thinners.  Taking medicines such as aspirin and ibuprofen. These medicines can thin your blood. Do not take these medicines unless your health care provider tells you to take them.  Taking over-the-counter medicines, vitamins, herbs, and supplements. General instructions  Follow instructions from your health care provider about eating or drinking restrictions.  You may have blood tests  to check how well your kidneys and liver are working and how well your blood can clot.  Plan to have someone take you home from the hospital or clinic. What happens during the procedure?   An IV will be inserted into one of your veins.  You may be given a medicine to help you relax (sedative).  You will lie down on an X-ray table. The table may be tilted in different directions during the procedure to help the contrast move throughout your body. Safety straps will keep you secure if the table is tilted.  If veins in your arm or leg will be examined, a band may be wrapped around that arm or leg to keep the veins full of blood. This may cause your arm or leg to feel numb.  The contrast will be injected into your IV. You may have a hot, flushed feeling as it moves throughout your body. You may also have a metallic taste in your mouth. Both of these sensations will go away after the test is complete.  You may be asked to lie in different positions or place your legs or arms in different positions.  At the end of the procedure, you may be given IV fluids to help wash or flush the contrast out of your veins.  The IV will be removed, and pressure will be applied to the IV site to prevent bleeding. A bandage (dressing) may be applied to the IV site. The exact procedure may vary among health care providers and hospitals. What can I expect after the procedure?  Your blood pressure, heart rate, breathing rate, and blood oxygen level will be monitored until you   leave the hospital or clinic.  You may be given something to eat and drink.  You may have bruising or mild discomfort in the area where the IV was inserted. Follow these instructions at home: Eating and drinking   Follow instructions from your health care provider about eating or drinking restrictions.  Drink a lot of water for the first several days after the procedure, as directed by your health care provider. This helps to flush the  contrast out of your body. Activity  Rest as told by your health care provider.  Return to your normal activities as told by your health care provider. Ask your health care provider what activities are safe for you.  If you were given a sedative during your procedure, do not drive for 24 hours or until your health care provider approves. General instructions  Check your IV insertion area every day for signs of infection. Check for: ? Redness, swelling, or pain. ? Fluid or blood. ? Warmth. ? Pus or a bad smell.  Take over-the-counter and prescription medicines only as told by your health care provider.  Keep all follow-up visits as told by your health care provider. This is important. Contact a health care provider if:  Your skin becomes itchy or you develop a rash or hives.  You have a fever that does not get better with medicine.  You feel nauseous or you vomit.  You have redness, swelling, or pain around the insertion site.  You have fluid or blood coming from the insertion site.  Your insertion area feels warm to the touch.  You have pus or a bad smell coming from the insertion site. Get help right away if you:  Have shortness of breath or difficulty breathing.  Develop chest pain.  Faint.  Feel very dizzy. These symptoms may represent a serious problem that is an emergency. Do not wait to see if the symptoms will go away. Get medical help right away. Call your local emergency services (911 in the [place] Do not drive yourself to the hospital. Summary  A venogram, or venography, is a procedure that uses an X-ray and contrast dye to check how well the veins work and how blood flows through them.  An IV will be inserted into one of your veins in order to inject the contrast.  During the exam, you will lie on an X-ray table. The table may be tilted in different directions during the procedure to help the contrast move throughout your body. Safety straps will keep you  secure.  After the procedure, you will need to drink a lot of water to help wash or flush the contrast out of your body. This information is not intended to replace advice given to you by your health care provider. Make sure you discuss any questions you have with your health care provider. Document Revised: 07/27/2018 Document Reviewed: 07/27/2018 Elsevier Patient Education  2020 [employer]  

## 2019-02-10 NOTE — Telephone Encounter (Signed)
-----   Message from Dalton S McLean, MD sent at 02/10/2019 11:43 AM EST ----- Needs BMET in 10 days.  Make sure she has followup with me in about 6 wks.   

## 2019-02-10 NOTE — Progress Notes (Signed)
Discharge instructions reviewed with patient and family. Verbalized understanding. 

## 2019-02-10 NOTE — Interval H&P Note (Signed)
History and Physical Interval Note:  02/10/2019 11:07 AM  Jacqueline Ayala  has presented today for surgery, with the diagnosis of hf.  The various methods of treatment have been discussed with the patient and family. After consideration of risks, benefits and other options for treatment, the patient has consented to  Procedure(s): RIGHT HEART CATH (N/A) as a surgical intervention.  The patient's history has been reviewed, patient examined, no change in status, stable for surgery.  I have reviewed the patient's chart and labs.  Questions were answered to the patient's satisfaction.     [employer]

## 2019-02-12 ENCOUNTER — Telehealth (HOSPITAL_COMMUNITY): Payer: Self-pay

## 2019-02-12 DIAGNOSIS — I5032 Chronic diastolic (congestive) heart failure: Secondary | ICD-10-CM

## 2019-02-12 NOTE — Telephone Encounter (Signed)
Patient advised and verbalized understanding, lab order entered,lab appt scheduled pt has pending appt 4/6

## 2019-02-12 NOTE — Telephone Encounter (Signed)
-----   Message from Laurey Morale, MD sent at 02/10/2019 11:43 AM EST ----- Needs BMET in 10 days.  Make sure she has followup with me in about 6 wks.

## 2019-02-26 ENCOUNTER — Ambulatory Visit (HOSPITAL_COMMUNITY)
Admission: RE | Admit: 2019-02-26 | Discharge: 2019-02-26 | Disposition: A | Discharge status: Home / Self Care | Payer: Medicare Other | Source: Ambulatory Visit | Attending: Internal Medicine | Admitting: Internal Medicine

## 2019-02-26 ENCOUNTER — Other Ambulatory Visit: Payer: Self-pay

## 2019-02-26 DIAGNOSIS — I5032 Chronic diastolic (congestive) heart failure: Secondary | ICD-10-CM | POA: Diagnosis present

## 2019-02-26 LAB — BASIC METABOLIC PANEL
Anion gap: 12 (ref 5–15)
BUN: 36 mg/dL — ABNORMAL HIGH (ref 6–20)
CO2: 31 mmol/L (ref 22–32)
Calcium: 9.1 mg/dL (ref 8.9–10.3)
Chloride: 95 mmol/L — ABNORMAL LOW (ref 98–111)
Creatinine, Ser: 1.22 mg/dL — ABNORMAL HIGH (ref 0.44–1.00)
GFR calc Af Amer: 59 mL/min — ABNORMAL LOW (ref >60)
GFR calc non Af Amer: 51 mL/min — ABNORMAL LOW (ref >60)
Glucose, Bld: 132 mg/dL — ABNORMAL HIGH (ref 70–99)
Potassium: 4.2 mmol/L (ref 3.5–5.1)
Sodium: 138 mmol/L (ref 135–145)

## 2019-04-08 ENCOUNTER — Encounter (HOSPITAL_COMMUNITY): Payer: Medicare Other | Admitting: Cardiology

## 2019-05-30 ENCOUNTER — Encounter (HOSPITAL_COMMUNITY): Payer: Medicare Other | Admitting: Cardiology

## 2019-09-05 ENCOUNTER — Encounter (HOSPITAL_COMMUNITY): Payer: Medicare Other | Admitting: Cardiology

## 2020-06-02 DEATH — deceased

## 2021-06-24 IMAGING — DX CHEST - 2 VIEW
2 series · 2 of 2 positions shown · non-contrast
Comparison: 11/07/2016

CLINICAL DATA: Shortness of breath several years.

EXAM:
CHEST - 2 VIEW

[chest pa]
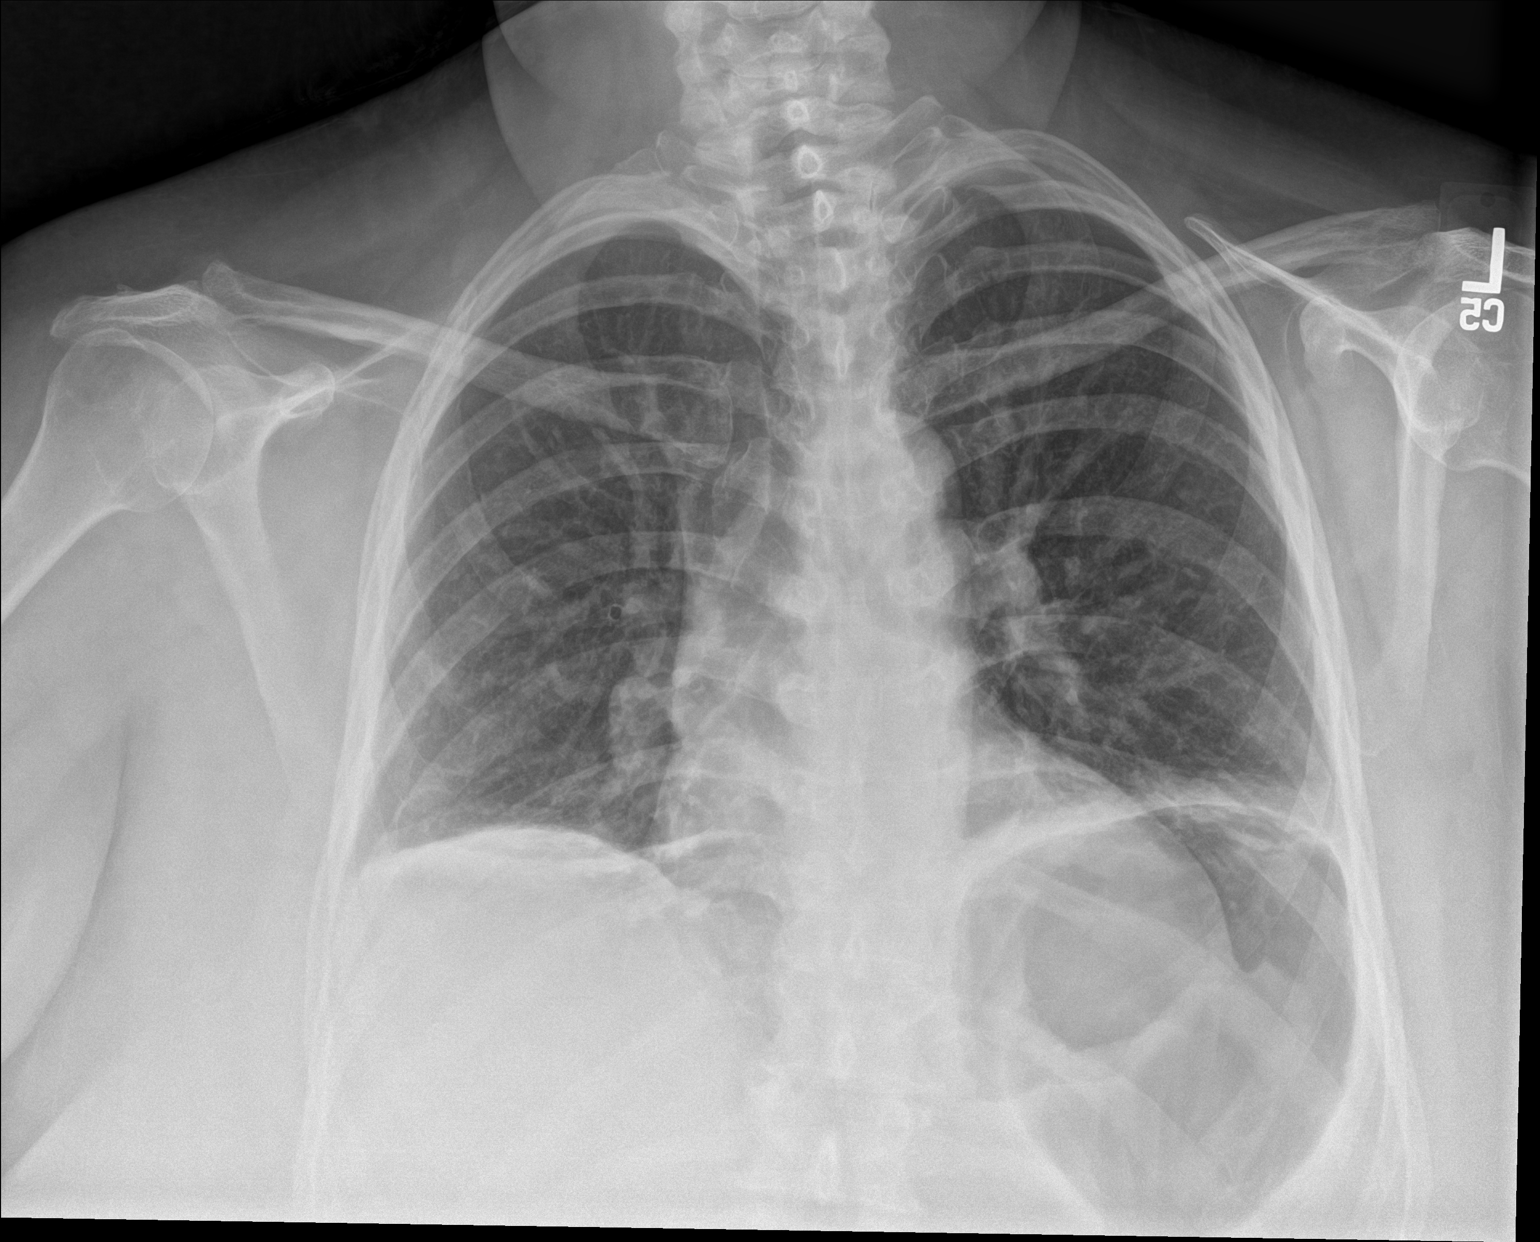

[chest lat]
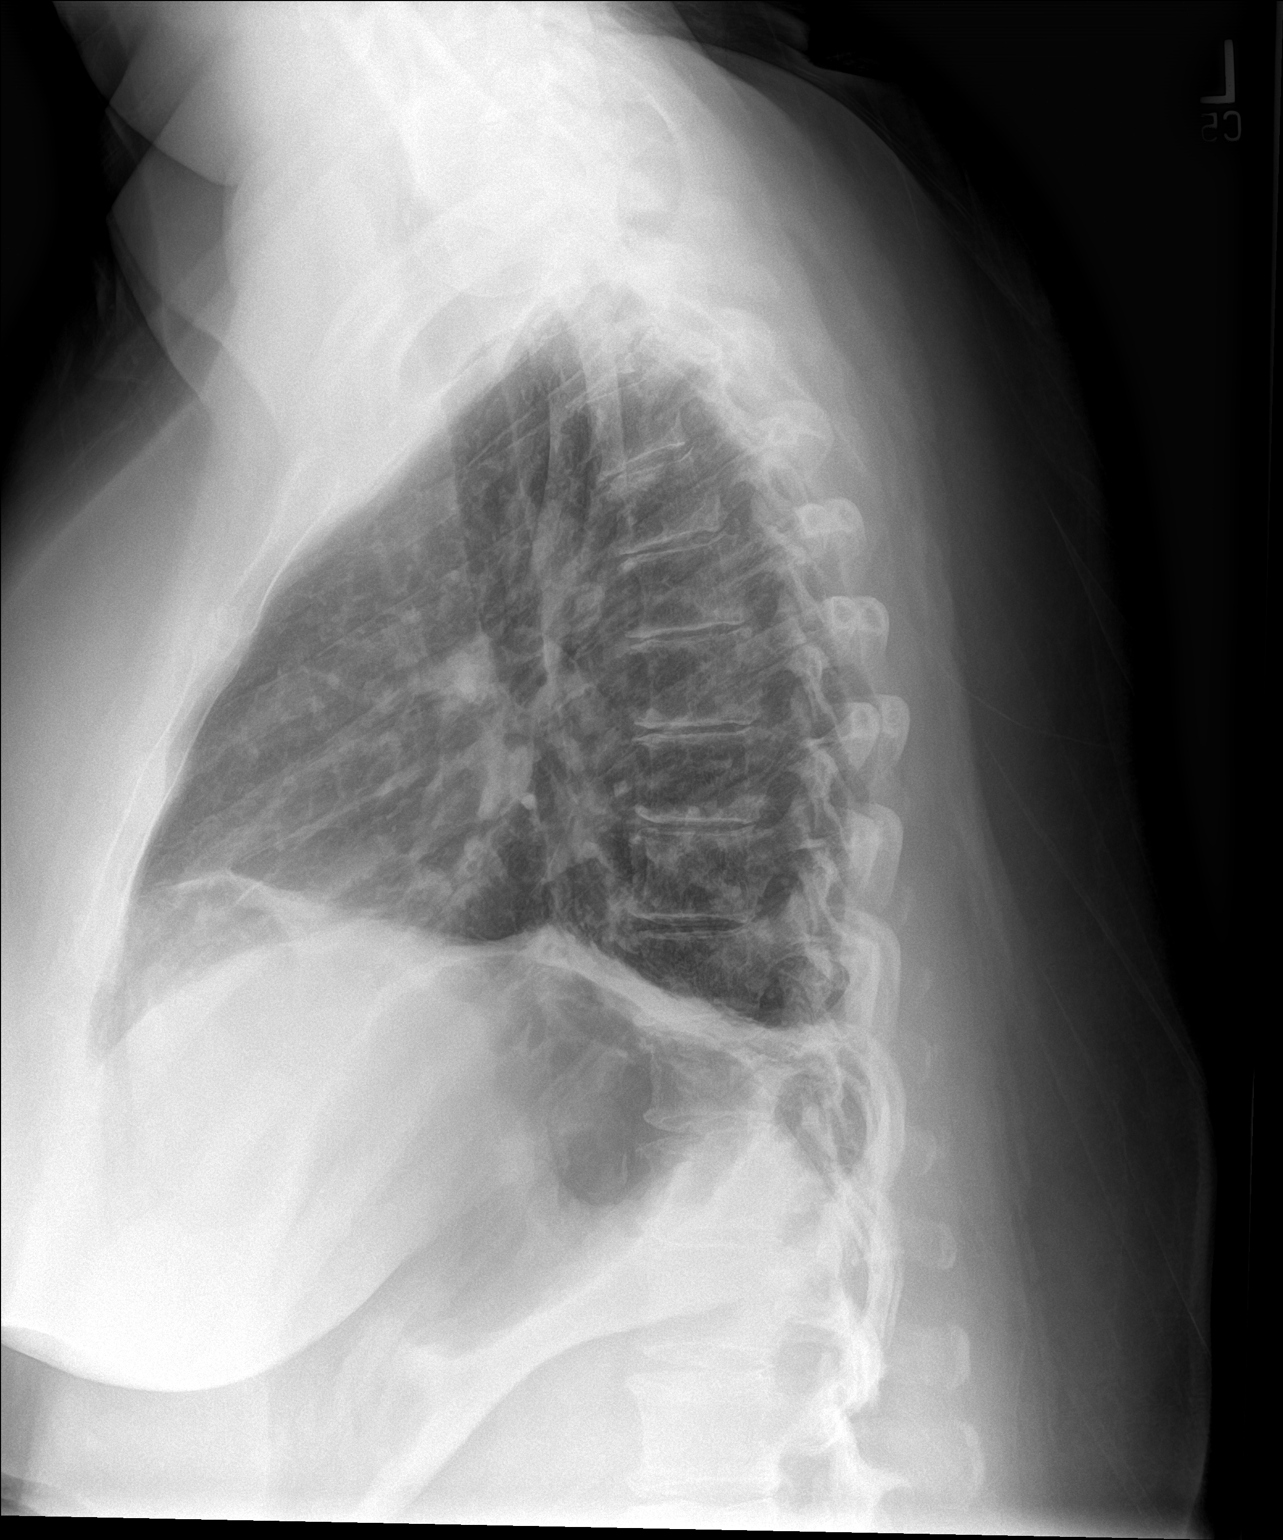

[2 of 2 positions shown; findings below may reference images not displayed]

FINDINGS: Lungs are hypoinflated with bibasilar linear density which is a
chronic finding and demonstrates slight interval progression. No new
focal airspace process. No significant effusion. Cardiomediastinal
silhouette and remainder of the exam is unchanged.
IMPRESSION: Bibasilar linear density with chronic component, but slightly worse
this may be due to slight chronic progression versus acute on
chronic process. Atelectasis or infection is possible. Recommend
follow-up chest radiograph 4 weeks.

## 2021-08-06 IMAGING — CT CT CHEST WITHOUT CONTRAST
2 of 4 series · 15 of 36 positions shown, 18 images · non-contrast
Comparison: None.

CLINICAL DATA: Orthopnea, hypoxemia

EXAM:
CT CHEST WITHOUT CONTRAST
TECHNIQUE: Multidetector CT imaging of the chest was performed following the
standard protocol without IV contrast.

[Series 3: routine chest without · axial · non-contrast · 0.64mm/px · z∈[+708,+902]mm · 12 of 115 slices shown, 15 images]
[im 9/115  mediastinal]
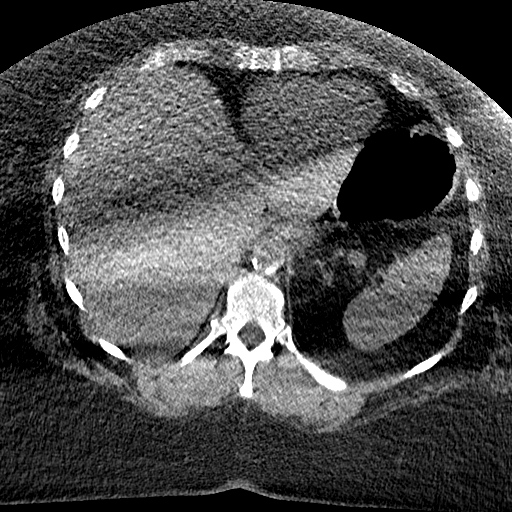
[im 9/115  lung]
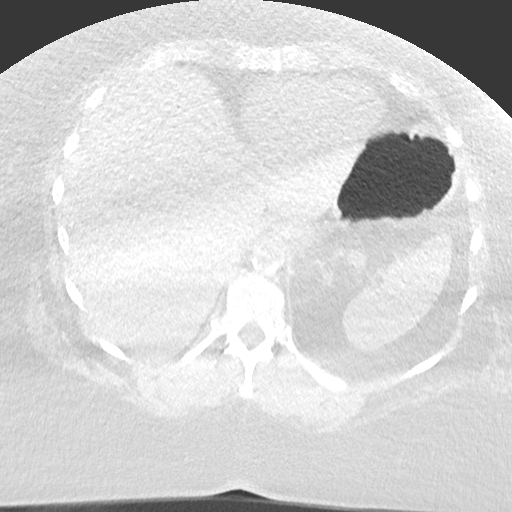
[im 18/115  lung]
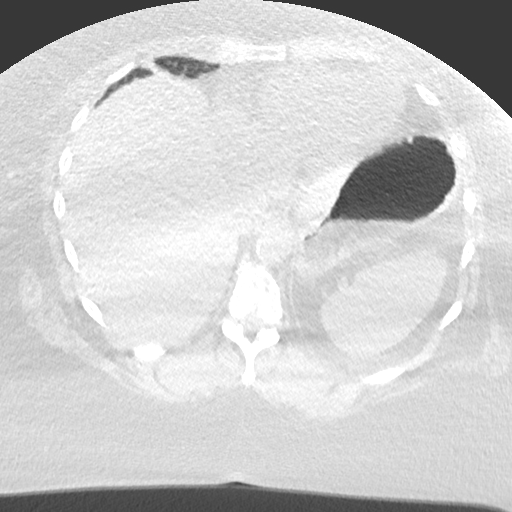
[im 27/115  lung]
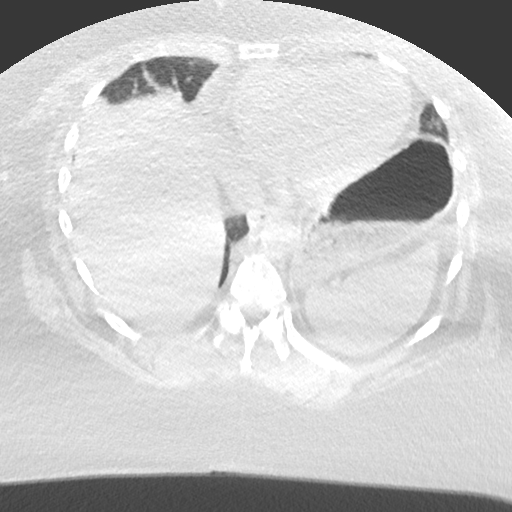
[im 36/115  lung]
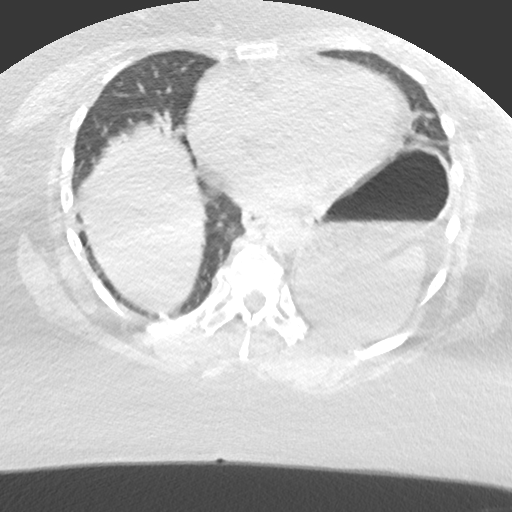
[im 44/115  mediastinal]
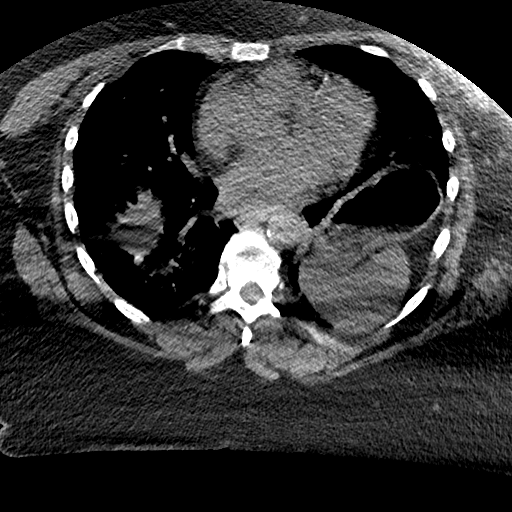
[im 44/115  lung]
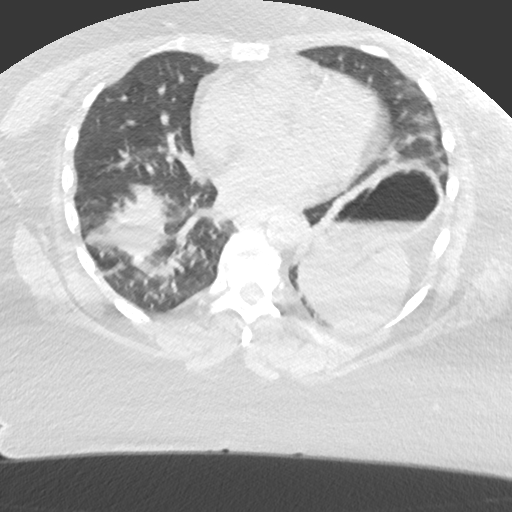
[im 53/115  lung]
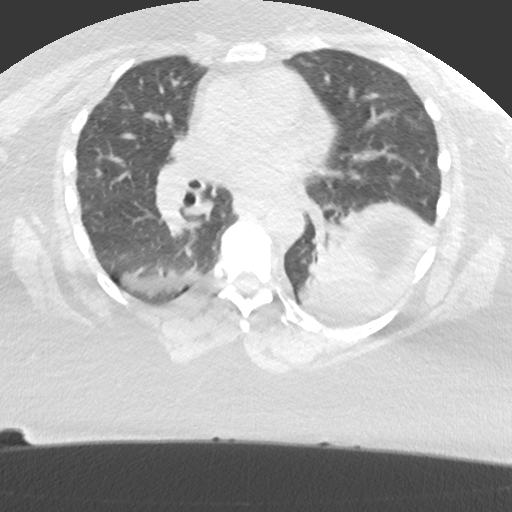
[im 62/115  lung]
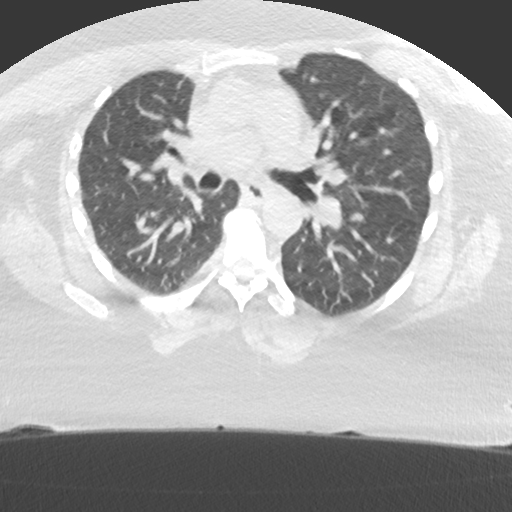
[im 71/115  lung]
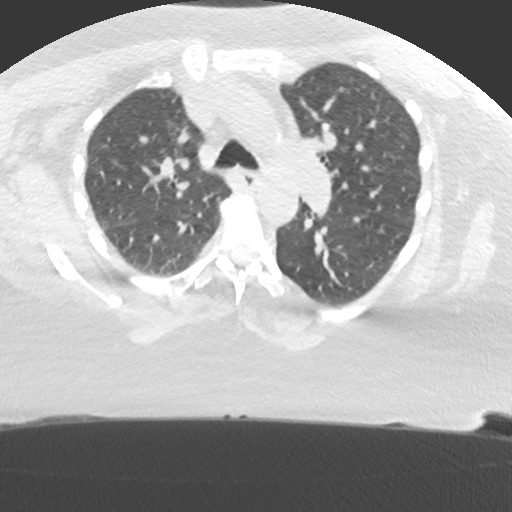
[im 79/115  mediastinal]
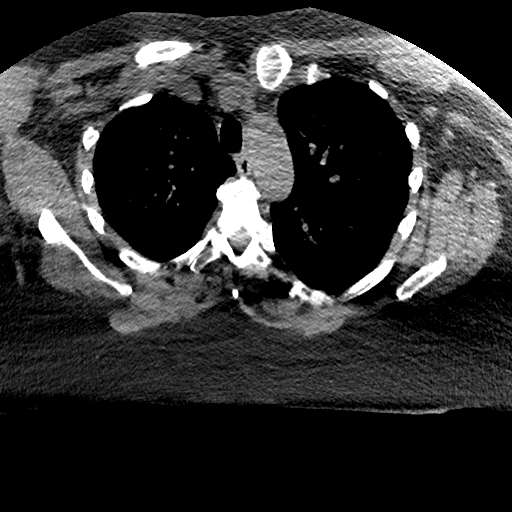
[im 79/115  lung]
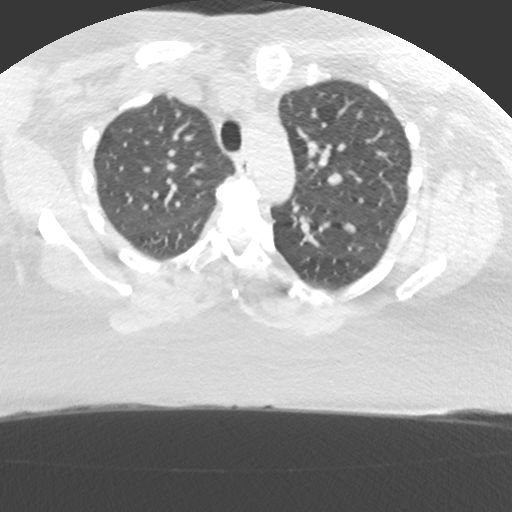
[im 88/115  lung]
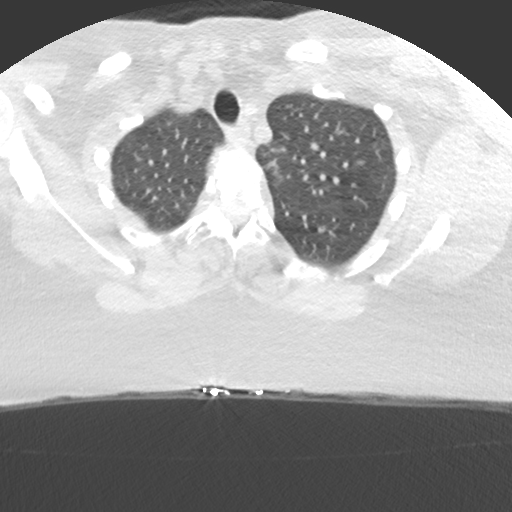
[im 97/115  lung]
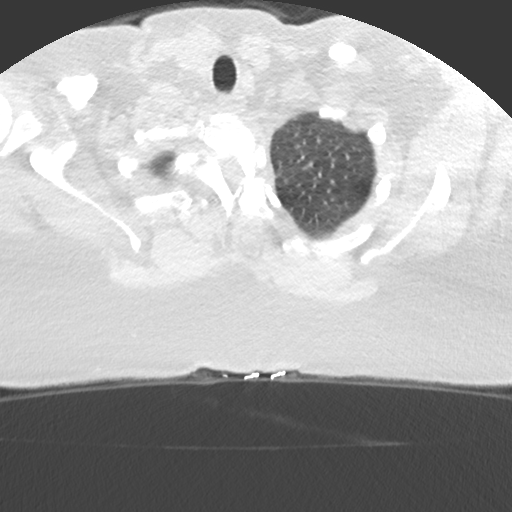
[im 106/115  lung]
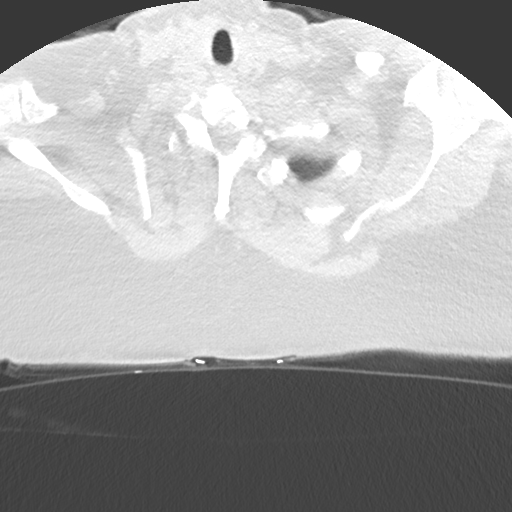

[Series 6: coronal · coronal · 0.51mm/px · 3 of 131 slices shown]
[im 27/131  lung]
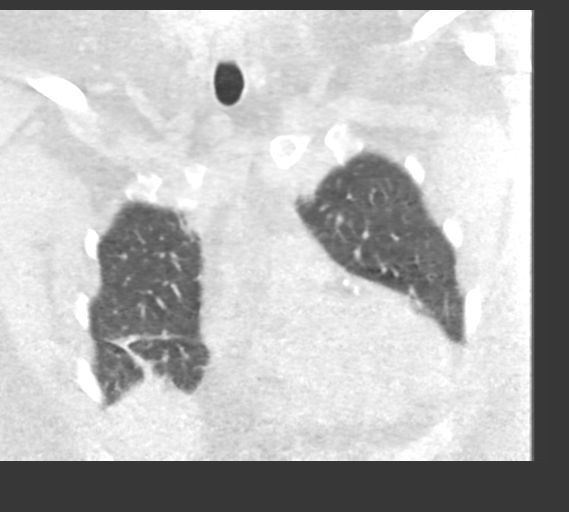
[im 53/131  lung]
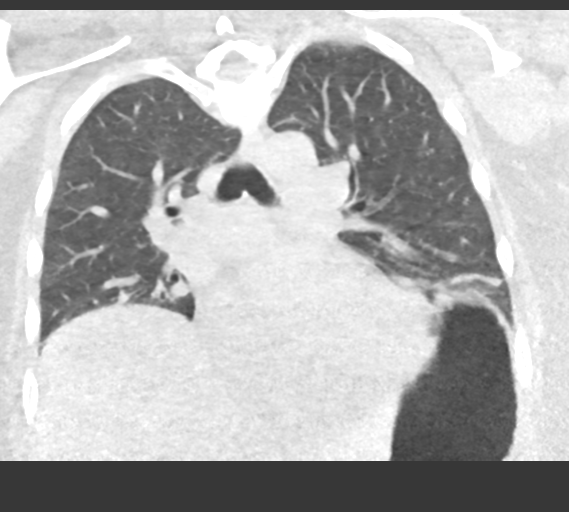
[im 79/131  lung]
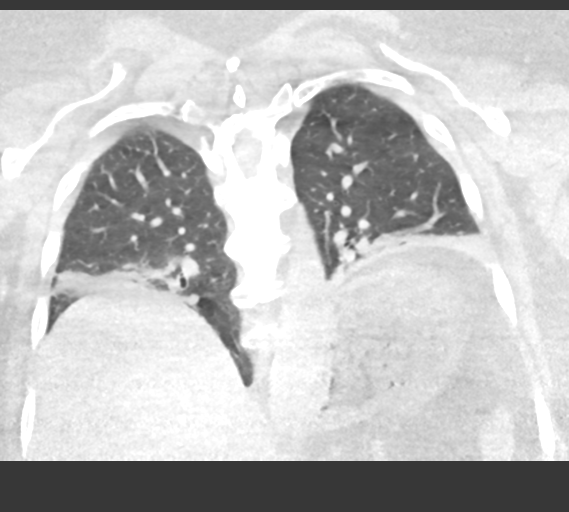

[15 of 36 positions shown; findings below may reference images not displayed]

FINDINGS: Cardiovascular: No significant vascular findings. Normal heart size.
No pericardial effusion.

Mediastinum/Nodes: No enlarged mediastinal, hilar, or axillary lymph
nodes. Thyroid gland, trachea, and esophagus demonstrate no
significant findings.

Lungs/Pleura: Low volume examination with bibasilar bandlike
scarring or atelectasis. No pleural effusion or pneumothorax.

Upper Abdomen: No acute abnormality.  Hepatic steatosis.

Musculoskeletal: No chest wall mass or suspicious bone lesions
identified. Disc degenerative disease of the thoracic spine.
IMPRESSION: 1. Low volume examination with bibasilar bandlike scarring or
atelectasis. No acute appearing airspace disease.

2.  Hepatic steatosis.
